# Patient Record
Sex: Female | Born: 1935 | Race: White | Hispanic: No | Marital: Married | Smoking: Never smoker
Health system: Southern US, Community
[De-identification: ages and names within clinical notes are randomized; demographics above are authoritative.]

## PROBLEM LIST (undated history)

## (undated) DIAGNOSIS — J309 Allergic rhinitis, unspecified: Secondary | ICD-10-CM

## (undated) DIAGNOSIS — E785 Hyperlipidemia, unspecified: Secondary | ICD-10-CM

## (undated) DIAGNOSIS — E109 Type 1 diabetes mellitus without complications: Secondary | ICD-10-CM

## (undated) DIAGNOSIS — M810 Age-related osteoporosis without current pathological fracture: Secondary | ICD-10-CM

## (undated) DIAGNOSIS — I4891 Unspecified atrial fibrillation: Secondary | ICD-10-CM

## (undated) DIAGNOSIS — I10 Essential (primary) hypertension: Secondary | ICD-10-CM

## (undated) DIAGNOSIS — E039 Hypothyroidism, unspecified: Secondary | ICD-10-CM

## (undated) HISTORY — DX: Hyperlipidemia, unspecified: E78.5

## (undated) HISTORY — DX: Allergic rhinitis, unspecified: J30.9

## (undated) HISTORY — DX: Essential (primary) hypertension: I10

## (undated) HISTORY — PX: VESICOVAGINAL FISTULA CLOSURE W/ TAH: SUR271

## (undated) HISTORY — DX: Unspecified atrial fibrillation: I48.91

## (undated) HISTORY — DX: Hypothyroidism, unspecified: E03.9

## (undated) HISTORY — DX: Type 1 diabetes mellitus without complications: E10.9

## (undated) HISTORY — DX: Age-related osteoporosis without current pathological fracture: M81.0

## (undated) HISTORY — PX: NASAL SINUS SURGERY: SHX719

---

## 2004-11-14 ENCOUNTER — Ambulatory Visit: Payer: Self-pay | Admitting: Internal Medicine

## 2005-01-05 ENCOUNTER — Ambulatory Visit: Payer: Self-pay | Admitting: Endocrinology

## 2005-06-24 ENCOUNTER — Ambulatory Visit: Payer: Self-pay | Admitting: Endocrinology

## 2005-10-28 ENCOUNTER — Ambulatory Visit: Payer: Self-pay | Admitting: Internal Medicine

## 2005-10-28 ENCOUNTER — Ambulatory Visit: Payer: Self-pay | Admitting: Endocrinology

## 2006-02-02 ENCOUNTER — Ambulatory Visit: Payer: Self-pay | Admitting: Endocrinology

## 2006-05-11 ENCOUNTER — Ambulatory Visit: Payer: Self-pay | Admitting: Internal Medicine

## 2006-07-07 ENCOUNTER — Ambulatory Visit: Payer: Self-pay | Admitting: Endocrinology

## 2006-11-03 ENCOUNTER — Ambulatory Visit: Payer: Self-pay | Admitting: Endocrinology

## 2007-03-16 ENCOUNTER — Ambulatory Visit: Payer: Self-pay | Admitting: Endocrinology

## 2007-03-16 LAB — CONVERTED CEMR LAB: Hgb A1c MFr Bld: 7.7 % — ABNORMAL HIGH (ref 4.6–6.0)

## 2007-04-26 ENCOUNTER — Encounter: Payer: Self-pay | Admitting: Endocrinology

## 2007-04-26 DIAGNOSIS — I10 Essential (primary) hypertension: Secondary | ICD-10-CM | POA: Insufficient documentation

## 2007-04-26 DIAGNOSIS — M810 Age-related osteoporosis without current pathological fracture: Secondary | ICD-10-CM | POA: Insufficient documentation

## 2007-04-26 DIAGNOSIS — I482 Chronic atrial fibrillation, unspecified: Secondary | ICD-10-CM | POA: Insufficient documentation

## 2007-04-26 DIAGNOSIS — E039 Hypothyroidism, unspecified: Secondary | ICD-10-CM

## 2007-04-26 DIAGNOSIS — E109 Type 1 diabetes mellitus without complications: Secondary | ICD-10-CM | POA: Insufficient documentation

## 2007-04-26 DIAGNOSIS — J309 Allergic rhinitis, unspecified: Secondary | ICD-10-CM | POA: Insufficient documentation

## 2007-07-25 ENCOUNTER — Encounter: Payer: Self-pay | Admitting: Endocrinology

## 2007-07-25 ENCOUNTER — Ambulatory Visit: Payer: Self-pay | Admitting: Endocrinology

## 2007-07-25 LAB — CONVERTED CEMR LAB: Hgb A1c MFr Bld: 7.8 % — ABNORMAL HIGH (ref 4.6–6.0)

## 2007-08-30 ENCOUNTER — Encounter: Payer: Self-pay | Admitting: Endocrinology

## 2007-11-22 ENCOUNTER — Ambulatory Visit: Payer: Self-pay | Admitting: Endocrinology

## 2008-01-16 ENCOUNTER — Encounter: Payer: Self-pay | Admitting: Endocrinology

## 2008-04-03 ENCOUNTER — Ambulatory Visit: Payer: Self-pay | Admitting: Endocrinology

## 2008-04-05 ENCOUNTER — Telehealth (INDEPENDENT_AMBULATORY_CARE_PROVIDER_SITE_OTHER): Payer: Self-pay | Admitting: *Deleted

## 2008-06-14 ENCOUNTER — Telehealth: Payer: Self-pay | Admitting: Endocrinology

## 2008-07-10 ENCOUNTER — Ambulatory Visit: Payer: Self-pay | Admitting: Endocrinology

## 2008-07-10 LAB — CONVERTED CEMR LAB: Hgb A1c MFr Bld: 8.2 % — ABNORMAL HIGH (ref 4.6–6.0)

## 2008-10-09 ENCOUNTER — Ambulatory Visit: Payer: Self-pay | Admitting: Endocrinology

## 2008-10-22 ENCOUNTER — Ambulatory Visit: Payer: Self-pay | Admitting: Internal Medicine

## 2008-10-29 LAB — CONVERTED CEMR LAB

## 2008-11-03 DIAGNOSIS — J45909 Unspecified asthma, uncomplicated: Secondary | ICD-10-CM | POA: Insufficient documentation

## 2008-12-04 ENCOUNTER — Ambulatory Visit: Payer: Self-pay | Admitting: Endocrinology

## 2009-01-03 ENCOUNTER — Encounter: Payer: Self-pay | Admitting: Endocrinology

## 2009-01-16 ENCOUNTER — Encounter: Payer: Self-pay | Admitting: Internal Medicine

## 2009-02-12 ENCOUNTER — Ambulatory Visit: Payer: Self-pay | Admitting: Endocrinology

## 2009-04-07 ENCOUNTER — Encounter: Payer: Self-pay | Admitting: Endocrinology

## 2009-05-30 ENCOUNTER — Ambulatory Visit: Payer: Self-pay | Admitting: Endocrinology

## 2009-05-30 LAB — CONVERTED CEMR LAB
Creatinine,U: 45.4 mg/dL
Microalb Creat Ratio: 6.6 mg/g (ref 0.0–30.0)
Microalb, Ur: 0.3 mg/dL (ref 0.0–1.9)

## 2009-07-10 ENCOUNTER — Telehealth: Payer: Self-pay | Admitting: Endocrinology

## 2009-07-25 ENCOUNTER — Encounter: Payer: Self-pay | Admitting: Endocrinology

## 2009-07-25 ENCOUNTER — Telehealth: Payer: Self-pay | Admitting: Endocrinology

## 2009-08-01 ENCOUNTER — Telehealth: Payer: Self-pay | Admitting: Endocrinology

## 2009-08-07 ENCOUNTER — Encounter: Payer: Self-pay | Admitting: Endocrinology

## 2009-09-10 ENCOUNTER — Ambulatory Visit: Payer: Self-pay | Admitting: Endocrinology

## 2009-09-18 ENCOUNTER — Telehealth: Payer: Self-pay | Admitting: Endocrinology

## 2009-12-10 ENCOUNTER — Ambulatory Visit: Payer: Self-pay | Admitting: Endocrinology

## 2009-12-10 LAB — CONVERTED CEMR LAB: Hgb A1c MFr Bld: 7.8 % — ABNORMAL HIGH (ref 4.6–6.5)

## 2010-02-04 ENCOUNTER — Ambulatory Visit: Payer: Self-pay | Admitting: Internal Medicine

## 2010-02-04 DIAGNOSIS — J018 Other acute sinusitis: Secondary | ICD-10-CM

## 2010-04-29 ENCOUNTER — Ambulatory Visit: Payer: Self-pay | Admitting: Endocrinology

## 2010-10-19 ENCOUNTER — Encounter: Payer: Self-pay | Admitting: Endocrinology

## 2010-10-20 ENCOUNTER — Telehealth: Payer: Self-pay | Admitting: Endocrinology

## 2010-10-28 NOTE — Assessment & Plan Note (Signed)
Summary: 3 mos f//u//cd   Vital Signs:  Patient profile:   77 year old female Height:      66 inches (167.64 cm) Weight:      174 pounds (79.09 kg) O2 Sat:      96 % on Room air Temp:     98.2 degrees F (36.78 degrees C) oral Pulse rate:   72 / minute BP sitting:   116 / 66  (left arm) Cuff size:   large  Vitals Entered By: Josph Macho RMA (December 10, 2009 3:09 PM)  O2 Flow:  Room air CC: 3 month follow up/ CF Is Patient Diabetic? Yes   Primary Provider:  kinlaw  CC:  3 month follow up/ CF.  History of Present Illness: pt says she feels much better since she had a recent illness.  she has mild hypoglycemia approx 2/week, at any time of day.   she brings a scant record of her cbg's which i have reviewed today.    Current Medications (verified): 1)  Humalog Pen 100 Unit/ml  Soln (Insulin Lispro (Human)) .... Qac (Three Times A Day) 06-08-10 Units 2)  Metformin Hcl 1000 Mg  Tabs (Metformin Hcl) .... Take 1 By Mouth Two Times A Day Qd 3)  Lipitor 20 Mg  Tabs (Atorvastatin Calcium) .... Take 1 By Mouth Qd 4)  Lisinopril-Hydrochlorothiazide 20-12.5 Mg  Tabs (Lisinopril-Hydrochlorothiazide) .... Take 1 By Mouth Qd 5)  Cardizem La 240 Mg  Tb24 (Diltiazem Hcl Coated Beads) .... Take 1 By Mouth Qd 6)  Synthroid 150 Mcg  Tabs (Levothyroxine Sodium) .... Take 1 By Mouth Qd 7)  Novolin N 100 Unit/ml  Susp (Insulin Isophane Human) .Marland Kitchen.. 14 Units Qhs 8)  Warfarin Sodium 5 Mg Tabs (Warfarin Sodium) .... Take 1 By Mouth Qd 9)  Guanfacine Hcl 2 Mg Tabs (Guanfacine Hcl) .Marland Kitchen.. 1 Two Times A Day 10)  Fluticasone Propionate 50 Mcg/act Susp (Fluticasone Propionate) .Marland Kitchen.. 1-2 Sprays Each Nostril Daily 11)  Proair Hfa 108 (90 Base) Mcg/act Aers (Albuterol Sulfate) .... 2 Puffs Four Times A Day As Needed 12)  One-A-Day Womens  Tabs (Multiple Vitamins-Calcium) .... One Daily 13)  Immune Health Blend  Caps (Echinacea-Goldenseal) .Marland Kitchen.. 1 Tab Daily 14)  Calcium 600/vitamin D 600-400 Mg-Unit Tabs (Calcium  Carbonate-Vitamin D) .Marland Kitchen.. 1 Daily 15)  Freestyle Test  Strp (Glucose Blood) .... Use 1 Strip Four Times A Day  Allergies (verified): 1)  ! Penicillin  Past History:  Past Medical History: Last updated: 10/09/2008 Smoker (Quit 1973) DM Nepharopathy Menopause Dyslipidemia Urolithiasis Macular Degeneration  OSTEOPOROSIS (ICD-733.00) HYPOTHYROIDISM (ICD-244.9) HYPERTENSION (ICD-401.9) DIABETES MELLITUS, TYPE I (ICD-250.01) ATRIAL FIBRILLATION (ICD-427.31) ALLERGIC RHINITIS (ICD-477.9)  Review of Systems  The patient denies syncope.    Physical Exam  General:  normal appearance.   Pulses:  dorsalis pedis intact bilat.   Extremities:  no deformity.  no ulcer on the feet.  feet are of normal color and temp.  no edema.  there are a few bilateral varicosities.    Neurologic:  sensation is intact to touch on the feet  Additional Exam:  Hemoglobin A1C       [H]  7.8 %    Impression & Recommendations:  Problem # 1:  DIABETES MELLITUS, TYPE I (ICD-250.01) this is the best control this pt should aim for, givenher hypoglycemic episodes.  Medications Added to Medication List This Visit: 1)  Humalog Pen 100 Unit/ml Soln (Insulin lispro (human)) .... Three times a day (just before each meal)  06-08-10 units  2)  Novolin N 100 Unit/ml Susp (Insulin isophane human) .Marland Kitchen.. 14 units at bedtime  Other Orders: TLB-A1C / Hgb A1C (Glycohemoglobin) (83036-A1C) Est. Patient Level III ([address])  Patient Instructions: 1)  tests are being ordered for you today.  a few days after the test(s), please call [phone] to hear your test results. 2)  pending the test results, please continue nph 13 units at bedtime. 3)  and continue humalog three times a day (just before each meal) 06-08-10 units. 4)  add 1 unit of humalog as needed for any glucose over 200 5)  Please schedule a follow-up appointment in 3 months. 6)  check your blood sugar 2 times a day.  vary the time of day when you check, between  before the 3 meals, and at bedtime.  also check if you have symptoms of your blood sugar being too high or too low.  please keep a record of the readings and bring it to your next appointment here.  please call us sooner if you are having low blood sugar episodes.

## 2010-10-28 NOTE — Assessment & Plan Note (Signed)
Summary: fu---d/t---stc   Vital Signs:  Patient profile:   77 year old female Height:      66 inches (167.64 cm) Weight:      174.19 pounds (79.18 kg) BMI:     28.22 O2 Sat:      96 % on Room air Temp:     97.4 degrees F (36.33 degrees C) oral Pulse rate:   64 / minute BP sitting:   148 / 88  (left arm) Cuff size:   regular  Vitals Entered By: Brenton Grills MA (April 29, 2010 1:10 PM)  O2 Flow:  Room air CC: F/U appt/both ankles swelling/pt is no longer taking Clarithromycin/aj Is Patient Diabetic? Yes   Primary Provider:  kinlaw  CC:  F/U appt/both ankles swelling/pt is no longer taking Clarithromycin/aj.  History of Present Illness: pt states she feels well in general, except for nasal congestion.  no cbg record, but states cbg's are well-controlled (except for steroid inject 4 mos ago).  she seldom has hypoglycemia, and these episodes are mild.  Current Medications (verified): 1)  Humalog Pen 100 Unit/ml  Soln (Insulin Lispro (Human)) .... Three Times A Day (Just Before Each Meal)  06-08-10 Units 2)  Metformin Hcl 1000 Mg  Tabs (Metformin Hcl) .... Take 1 By Mouth Two Times A Day Qd 3)  Lipitor 20 Mg  Tabs (Atorvastatin Calcium) .... Take 1 By Mouth Qd 4)  Lisinopril-Hydrochlorothiazide 20-12.5 Mg  Tabs (Lisinopril-Hydrochlorothiazide) .... Take 1 By Mouth Qd 5)  Cardizem La 240 Mg  Tb24 (Diltiazem Hcl Coated Beads) .... Take 1 By Mouth Qd 6)  Synthroid 150 Mcg  Tabs (Levothyroxine Sodium) .... Take 1 By Mouth Qd 7)  Novolin N 100 Unit/ml  Susp (Insulin Isophane Human) .Marland Kitchen.. 14 Units At Bedtime 8)  Warfarin Sodium 5 Mg Tabs (Warfarin Sodium) .... Take 1 By Mouth Qd 9)  Guanfacine Hcl 2 Mg Tabs (Guanfacine Hcl) .Marland Kitchen.. 1 Two Times A Day 10)  Fluticasone Propionate 50 Mcg/act Susp (Fluticasone Propionate) .Marland Kitchen.. 1-2 Sprays Each Nostril Daily 11)  Proair Hfa 108 (90 Base) Mcg/act Aers (Albuterol Sulfate) .... 2 Puffs Four Times A Day As Needed 12)  One-A-Day Womens  Tabs (Multiple  Vitamins-Calcium) .... One Daily 13)  Immune Health Blend  Caps (Echinacea-Goldenseal) .Marland Kitchen.. 1 Tab Daily 14)  Calcium 600/vitamin D 600-400 Mg-Unit Tabs (Calcium Carbonate-Vitamin D) .Marland Kitchen.. 1 Daily 15)  Freestyle Test  Strp (Glucose Blood) .... Use 1 Strip Four Times A Day 16)  Clarithromycin 500 Mg Tabs (Clarithromycin) .Marland Kitchen.. 1 Twice Daily After Meals  Allergies (verified): 1)  ! Penicillin  Past History:  Past Medical History: Last updated: 10/09/2008 Smoker (Quit 1973) DM Nepharopathy Menopause Dyslipidemia Urolithiasis Macular Degeneration  OSTEOPOROSIS (ICD-733.00) HYPOTHYROIDISM (ICD-244.9) HYPERTENSION (ICD-401.9) DIABETES MELLITUS, TYPE I (ICD-250.01) ATRIAL FIBRILLATION (ICD-427.31) ALLERGIC RHINITIS (ICD-477.9)  Review of Systems  The patient denies syncope.    Physical Exam  General:  normal appearance.   Pulses:  dorsalis pedis intact bilat.   Extremities:  no deformity.  no ulcer on the feet.  feet are of normal color and temp.  there are a few bilateral varicosities.   1+ right pedal edema and 1+ left pedal edema.   Neurologic:  sensation is intact to touch on the feet Additional Exam:  Hemoglobin A1C       [H]  7.9 %    Impression & Recommendations:  Problem # 1:  DIABETES MELLITUS, TYPE I (ICD-250.01) needs increased rx  Medications Added to Medication List This  Visit: 1)  Humalog Pen 100 Unit/ml Soln (Insulin lispro (human)) .... Three times a day (just before each meal)  07-08-10 units  Other Orders: TLB-A1C / Hgb A1C (Glycohemoglobin) (83036-A1C) Est. Patient Level III (69629)  Patient Instructions: 1)  blood tests are being ordered for you today.  please call [phone] to hear your test results. 2)  pending the test results, please continue the same medications for now 3)  Please schedule a follow-up appointment in 3 months. 4)  (update: i left message on phone-tree:  increase humalog to (just before each meal) 07-08-10 units).

## 2010-10-28 NOTE — Assessment & Plan Note (Signed)
Summary: PER PT CALL/CB   Primary Provider/Referring Provider:  kinlaw  CC:  Increased Allergies-pain in sinus area; Pressure(stopped up) in both ears..  History of Present Illness: 10/22/08- Asthma, allergic rhinitis Had diabetic retinopathy- lost her license for awhile but can drive and read again now. She had atrial fib- hosp, then nuclear stress test normal but still on warfarin- managed in [place].  Not on allergy vaccine in several years (Dr Andria Meuse). More trouble with nasal and chest congestion since October. Current episode started a week ago with frontal headache, sore throat but no fever.  Clear mucus from nose and chest- mucinex helps. Mild GI queasy. Ears stopped up.No inhaled medicines now, but remembers that Flonase and rescue inhalers used to help.  Feb 04, 2010- Asthma, allergic rhinitis, DM, AF/ Coumadin Acute visit. She had been fine this spring, until she woke this AM with right maxiallary and ear pain, headache. Nose has been stuffy and dripping, but not blowing out much. Chest seems a little tight. She used her Proair with some help.      Current Medications (verified): 1)  Humalog Pen 100 Unit/ml  Soln (Insulin Lispro (Human)) .... Three Times A Day (Just Before Each Meal)  06-08-10 Units 2)  Metformin Hcl 1000 Mg  Tabs (Metformin Hcl) .... Take 1 By Mouth Two Times A Day Qd 3)  Lipitor 20 Mg  Tabs (Atorvastatin Calcium) .... Take 1 By Mouth Qd 4)  Lisinopril-Hydrochlorothiazide 20-12.5 Mg  Tabs (Lisinopril-Hydrochlorothiazide) .... Take 1 By Mouth Qd 5)  Cardizem La 240 Mg  Tb24 (Diltiazem Hcl Coated Beads) .... Take 1 By Mouth Qd 6)  Synthroid 150 Mcg  Tabs (Levothyroxine Sodium) .... Take 1 By Mouth Qd 7)  Novolin N 100 Unit/ml  Susp (Insulin Isophane Human) .Marland Kitchen.. 14 Units At Bedtime 8)  Warfarin Sodium 5 Mg Tabs (Warfarin Sodium) .... Take 1 By Mouth Qd 9)  Guanfacine Hcl 2 Mg Tabs (Guanfacine Hcl) .Marland Kitchen.. 1 Two Times A Day 10)  Fluticasone Propionate 50 Mcg/act Susp  (Fluticasone Propionate) .Marland Kitchen.. 1-2 Sprays Each Nostril Daily 11)  Proair Hfa 108 (90 Base) Mcg/act Aers (Albuterol Sulfate) .... 2 Puffs Four Times A Day As Needed 12)  One-A-Day Womens  Tabs (Multiple Vitamins-Calcium) .... One Daily 13)  Immune Health Blend  Caps (Echinacea-Goldenseal) .Marland Kitchen.. 1 Tab Daily 14)  Calcium 600/vitamin D 600-400 Mg-Unit Tabs (Calcium Carbonate-Vitamin D) .Marland Kitchen.. 1 Daily 15)  Freestyle Test  Strp (Glucose Blood) .... Use 1 Strip Four Times A Day  Allergies (verified): 1)  ! Penicillin  Past History:  Past Medical History: Last updated: 10/09/2008 Smoker (Quit 1973) DM Nepharopathy Menopause Dyslipidemia Urolithiasis Macular Degeneration  OSTEOPOROSIS (ICD-733.00) HYPOTHYROIDISM (ICD-244.9) HYPERTENSION (ICD-401.9) DIABETES MELLITUS, TYPE I (ICD-250.01) ATRIAL FIBRILLATION (ICD-427.31) ALLERGIC RHINITIS (ICD-477.9)  Past Surgical History: Last updated: 10/22/2008 Hysterectomy Left ovary only Sinus surgery  Family History: Last updated: 10/22/2008 Mother's side- asthma, allergy  Social History: Last updated: 10/22/2008 Patient states former smoker. - 40 yrs ago Married  Risk Factors: Smoking Status: quit (10/22/2008)  Review of Systems      See HPI       The patient complains of headaches and nasal congestion/difficulty breathing through nose.  The patient denies shortness of breath with activity, shortness of breath at rest, productive cough, non-productive cough, coughing up blood, chest pain, irregular heartbeats, acid heartburn, indigestion, loss of appetite, weight change, abdominal pain, difficulty swallowing, sore throat, tooth/dental problems, and sneezing.    Vital Signs:  Patient profile:   77 year  old female Height:      66 inches Weight:      166 pounds BMI:     26.89 O2 Sat:      97 % on Room air Pulse rate:   71 / minute BP sitting:   140 / 78  (left arm) Cuff size:   regular  Vitals Entered By: Reynaldo Minium CMA (Feb 04, 2010 1:55 PM)  O2 Flow:  Room air  Physical Exam  Additional Exam:  General: A/Ox3; pleasant and cooperative, NAD, hard of hearing SKIN: no rash, lesions NODES: no lymphadenopathy HEENT: Pace/AT, EOM- WNL, Conjuctivae- clear, PERRLA, TM-WNL, Nose- clear, Throat- clear and wnl, Mallampati  II NECK: Supple w/ fair ROM, JVD- none, normal carotid impulses w/o bruits Thyroid-  CHEST: Clear to P&A HEART: RRR, no m/g/r heard ABDOMEN: Soft and nl;  XFG:HWEX, nl pulses, no edema  NEURO: Grossly intact to observation      Impression & Recommendations:  Problem # 1:  RHINOSINUSITIS, ACUTE (ICD-461.8)  Rhinosinusitis. Abrupt onset favors some infection. We discussed antibiotics and coumadin. Her updated medication list for this problem includes:    Fluticasone Propionate 50 Mcg/act Susp (Fluticasone propionate) .Marland Kitchen... 1-2 sprays each nostril daily    Clarithromycin 500 Mg Tabs (Clarithromycin) .Marland Kitchen... 1 twice daily after meals  Problem # 2:  ASTHMA (ICD-493.90) Not yet flaring with this acute episode.  Medications Added to Medication List This Visit: 1)  Clarithromycin 500 Mg Tabs (Clarithromycin) .Marland Kitchen.. 1 twice daily after meals  Other Orders: Est. Patient Level III (93716) Prescription Created Electronically [phone])  Patient Instructions: 1)  Please schedule a follow-up appointment in 1 year. Call sooner if needed. 2)  Script for antibiotic sent to your drug store. Reduce your coumadin dose by half while on antibiotic. 3)  neb neo nasal 4)  This is a good time to use Sudafed for congestion, and a Neti pot to help clear your nasal passages. Prescriptions: CLARITHROMYCIN 500 MG TABS (CLARITHROMYCIN) 1 twice daily after meals  #14 x 0   Entered and Authorized by:   Waymon Budge MD   Signed by:   Waymon Budge MD on 02/04/2010   Method used:   Electronically to        [facility]* (retail)       [address] [place], [place]   [id number]       Ph: [phone]       Fax: [phone]   RxID:   [id number]

## 2010-10-29 ENCOUNTER — Encounter (INDEPENDENT_AMBULATORY_CARE_PROVIDER_SITE_OTHER): Payer: PRIVATE HEALTH INSURANCE

## 2010-10-29 ENCOUNTER — Encounter (INDEPENDENT_AMBULATORY_CARE_PROVIDER_SITE_OTHER): Payer: PRIVATE HEALTH INSURANCE | Admitting: Vascular Surgery

## 2010-10-29 DIAGNOSIS — I83009 Varicose veins of unspecified lower extremity with ulcer of unspecified site: Secondary | ICD-10-CM

## 2010-10-29 DIAGNOSIS — L97919 Non-pressure chronic ulcer of unspecified part of right lower leg with unspecified severity: Secondary | ICD-10-CM

## 2010-10-29 DIAGNOSIS — I83219 Varicose veins of right lower extremity with both ulcer of unspecified site and inflammation: Secondary | ICD-10-CM

## 2010-10-30 NOTE — Progress Notes (Signed)
Summary: FYI  Phone Note Outgoing Call Call back at [facility] Phone [phone]   Call placed by: Brenton Grills CMA Duncan Dull),  October 20, 2010 11:54 AM Call placed to: Patient Summary of Call: Per MD, pt is due for F/U OV. Pt states that because of recent health problems she is unable to travel out of town to see SAE and is now seeing Dr. Lossie Faes for her DM and as her PCP.

## 2010-10-30 NOTE — Medication Information (Signed)
Summary: Diabetic supplies/[facility]  Diabetic supplies/[facility]   Imported By: Lester [place] 10/23/2010 09:45:24  _____________________________________________________________________  External Attachment:    Type:   Image     Comment:   External Document

## 2010-11-07 NOTE — Procedures (Unsigned)
LOWER EXTREMITY VENOUS REFLUX EXAM  INDICATION:  Ruptured varicose vein ulcer, swelling, pain.  EXAM:  Using color-flow imaging and pulse Doppler spectral analysis, the bilateral common femoral, superficial femoral, popliteal, posterior tibial, greater and lesser saphenous veins are evaluated.  There is evidence suggesting deep venous insufficiency in the bilateral common femoral vein and the right superficial femoral vein of the lower extremity.  The bilateral saphenofemoral junction is competent.  The bilateral GSV is competent.  The  bilateral proximal short saphenous vein demonstrates competency.  GSV Diameter (used if found to be incompetent only)                                           Right    Left Proximal Greater Saphenous Vein           cm       cm Proximal-to-mid-thigh                     cm       cm Mid thigh                                 cm       cm Mid-distal thigh                          cm       cm Distal thigh                              cm       cm Knee                                      cm       cm  IMPRESSION: 1. Bilateral greater saphenous vein is competent. 2. The bilateral greater saphenous vein is not tortuous. 3. The deep venous system is not competent at the bilateral common     femoral vein and the right superficial femoral vein. 4. The bilateral short saphenous vein is competent.  ___________________________________________ Larina Earthly, M.D.  LT/MEDQ  D:  10/29/2010  T:  10/29/2010  Job:  [id number]

## 2010-11-10 NOTE — Consult Note (Signed)
NEW PATIENT CONSULTATION  [name], [name] DOB:  Dec 17, 1932                                       10/29/2010 CHART#:[id number]  HISTORY OF PRESENT ILLNESS:  The patient presents today for evaluation of swelling and venous hypertension, and venous ulceration over her left leg.  She is a 77 year old white female with a long history of lower extremity swelling.  She has had mild ulceration over her right calf in the past.  She apparently had an episode of severe fluid overload with marked swelling in both lower extremities and apparently pulmonary edema as well.  She been treated with increased diuresis and has less swelling and less breathing difficulty.  She, after the extensive swelling, was left with a very large lateral posterior left calf venous ulcer.  She has a large eschar present over this.  She does have some pain associated with this as well.  She is a long-term diabetic and is insulin dependent.  She also has hypertension.  PAST SURGICAL HISTORY:  Significant for tonsillectomy, hysterectomy, and sinus surgery.  SOCIAL HISTORY:  She is married with 4 children.  She is retired.  She does not smoke or drink alcohol.  FAMILY HISTORY:  Negative for premature atherosclerotic disease.  REVIEW OF SYSTEMS:  Positive for weight gain up to 166 pounds.  She is 5 feet 5 inches tall.  She does have pain in her feet and nonhealing ulceration. CARDIAC:  Positive for atrial fibrillation. GI:  Constipation. PULMONARY:  Asthma. ENT:  Change in hearing. SKIN:  Ulcer of her left calf. Review of systems otherwise negative.  PHYSICAL EXAM:  Well-developed, well-nourished, white female appearing stated age, in no acute stress.  Blood pressure is 150/74, pulse 83, respirations 18.  HEENT is normal.  She does have 2+ radial and 2+ dorsalis pedis pulses bilaterally.  Musculoskeletal:  No major deformities or cyanosis.  Neurologic:  No focal weakness or paresthesias.   Skin:  Without rashes.  She does have some superficial ulcerations over her right medial calf and has a large 5 x 7-cm eschar on her left posterior lateral calf.  She underwent noninvasive vascular laboratory studies in our office today which I independently reviewed with her.  This shows no evidence of significant reflux in her surface veins.  She does have some reflux in her common femoral veins bilaterally.  She does not have any evidence of DVT.  ASSESSMENT AND PLAN:  I discussed this at length with the patient, with her husband present.  I explained that this is related to the swelling. I explained that she does not have any correctable source of her venous hypertension.  I explained the critical importance of elevation and compression.  We have started her today on Silvadene ointment to the open eschar and wrapped her with an Ace compression dressing, and instructed her on the use of this.  She was instructed to wear this daily when she is up walking.  Once she has healed the ulcer, she understands that she will need lifelong compression to prevent and reduce the risk for recurrence.  We will see her again in 1 month to continue followup.    Ann [name], M.D.  TFE/MEDQ  D:  10/29/2010  T:  10/30/2010  Job:  [id number]

## 2010-12-03 ENCOUNTER — Ambulatory Visit (INDEPENDENT_AMBULATORY_CARE_PROVIDER_SITE_OTHER): Payer: PRIVATE HEALTH INSURANCE | Admitting: Vascular Surgery

## 2010-12-03 DIAGNOSIS — I83893 Varicose veins of bilateral lower extremities with other complications: Secondary | ICD-10-CM

## 2010-12-04 NOTE — Assessment & Plan Note (Signed)
OFFICE VISIT  [name], [name] DOB:  1932/12/28                                       12/03/2010 CHART#:[id number]  The patient presented today for followup of her ulceration of her left posterior calf.  I had seen her initially on August 29, 2011.  She underwent venous ulcer at that time and did not have any evidence of chronic venous hypertension.  She has done very well with elevation and compression and is being treated at a wound center in [place] currently with Santyl for debridement of the ulcer.  She continues to have a 2 to 3+ dorsalis pedis pulse in her left leg.  Her blood pressure today is 145/74, heart rate is 45, respirations 18.  She does have no progression in her ulcer.  The eschar is beginning to be debrided with the dressing changes.  She has approximately 20% of the ulcer with granulation tissue and the remaining stable with softening of eschar.  I am pleased with her progress.  She will continue her followup in the [facility] and will see [place] again on an as-needed basis.    Larina Earthly, M.D. Electronically Signed  TFE/MEDQ  D:  12/03/2010  T:  12/04/2010  Job:  [id number]  cc:   Marlow Baars, MD, [place], [place]

## 2011-01-21 ENCOUNTER — Other Ambulatory Visit: Payer: Self-pay | Admitting: *Deleted

## 2011-01-21 MED ORDER — ALBUTEROL SULFATE HFA 108 (90 BASE) MCG/ACT IN AERS: 2.0000 | INHALATION_SPRAY | Freq: Four times a day (QID) | RESPIRATORY_TRACT | Status: DC | PRN

## 2011-02-10 NOTE — Consult Note (Signed)
[facility]                          ENDOCRINOLOGY CONSULTATION   EMMALIN, JAQUESS                       MRN:          [id number]  DATE:03/16/2007                            DOB:          13-Sep-1933    REASON FOR VISIT:  Follow up diabetes.   HISTORY OF PRESENT ILLNESS:  Patient is a 77 year old woman, who is  having frequent hypoglycemia after breakfast.  Her glucoses are highest  at h.s.  She states, I adjust the insulin as I need to.   PAST MEDICAL HISTORY:  1. Macular degeneration.  2. Osteoporosis.  3. Atrial fibrillation.  4. Hypertension.  5. Urolithiasis.  6. Allergic rhinitis.  7. Dyslipidemia.  8. Hypothyroidism.  9. Menopause.  10.Diabetes nephropathy.   REVIEW OF SYSTEMS:  Denies loss of consciousness.   PHYSICAL EXAMINATION:  Blood pressure 153/75, heart rate 66, temperature  is 98.6, the weight is 186.  GENERAL:  No distress.  Insulin injection sites at the anterior abdomen  are normal.   LABORATORY STUDIES:  On March 16, 2007, hemoglobin A1c 7.7.   IMPRESSION:  Given her refusal of insulin pump therapy and her self-  adjustments of her insulin, she probably will not achieve and keep an  A1c less than 7.  I will do the best I can.   PLAN:  1. Decrease Lantus to 16 units q.h.s.  2. Change q.a.c. Humalog to 05-03-12.  3. Return in about four months.  4. Do not adjust the insulin, except you can take one extra unit of      Humalog p.r.n. any glucose above 200.     Sean A. Everardo All, MD  Electronically Signed    SAE/MedQ  DD: 03/18/2007  DT: 03/18/2007  Job #: [id number]   cc:   [facility]

## 2011-02-13 NOTE — Assessment & Plan Note (Signed)
[facility]                               PULMONARY OFFICE NOTE   Ann [name], Ann [name]                       MRN:          [id number]  DATE:05/11/2006                            DOB:          19-Jan-1933    PROBLEM LIST:  1. Asthma.  2. Allergic rhinitis.  3. Diabetes.  4. Hypertension.  5. Atrial fibrillation.   HISTORY:  This woman with a history of allergic rhinitis and previous sinus  surgery was last here in February 2006.  She returns now for followup  reporting that Dr. Lawana Pai had to treat her for a sinusitis with antibiotics  2-3 weeks ago.  She feels she is almost completely recovered from that  episode, except that she still has some pressure under the right eye, a  little soreness in the right ear, and at the right side of her nose.  She is  still blowing some thick white discharge but nothing purulent or bloody.  She had tried Astelin of year or more in the past and would like a new  prescription, feeling that may be appropriate for this.   MEDICATIONS:  1. Insulin.  2. Metformin.  3. Lipitor 20 mg.  4. Flonase.  5. Levothroid 0.125 mg.  6. Lisinopril 20/12.5.  7. Guaifenesin.  8. Diltiazem ER 240 mg.  9. Actonel 35 mg.  10.Aspirin.  11.Rescue albuterol inhaler.   DRUG INTOLERANCE:  PENICILLIN.   OBJECTIVE:  VITAL SIGNS:  Weight 186 pounds, BP 132/84, pulse feels regular  to me at 76 per minute with no audible murmur or gallop.  Room air  saturation at rest is 98%.  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  There was some wax in  the left canal without obstruction.  The exposed drum looked intact.  Nasal  mucosa was wet but not obstructed.  Pharynx was clear.  Voice quality was  normal.  LUNGS:  Clear to P&A.  HEART:  Sounds were without murmur or gallop.   IMPRESSION:  Recent acute rhinosinusitis aggravating a background of  allergic  rhinitis.   She has not had a recent flare of her mild intermittent asthma as near as I  can tell and none is demonstrated now.   PLAN:  1. I refilled albuterol as a rescue inhaler and generic Flonase.  2. I also gave a prescription with discussion again of Astelin once or      twice each nostril b.i.d. on a p.r.n. basis.  3. We discussed saline nasal lavage as an early intervention at the first      suspicion of sinus infection.  4. Scheduled return 1 year, earlier p.r.n.                                   Clinton D. Maple Hudson, MD, FCCP, FACP   CDY/MedQ  DD:  05/11/2006  DT:  05/11/2006  Job #:  [id number]   cc:  Roney Marion, MD

## 2011-02-13 NOTE — Consult Note (Signed)
[facility]                          ENDOCRINOLOGY CONSULTATION   Ann [name], Ann [name]                       MRN:          [id number]  DATE:11/03/2006                            DOB:          04/25/1933    REASON FOR VISIT:  Follow up diabetes.   HISTORY OF PRESENT ILLNESS:  A 77 year old woman who states her glucoses  are sometimes in the high 100's in the morning, but well controlled  overall.   PAST MEDICAL HISTORY:  Same as July 07, 2006.   REVIEW OF SYSTEMS:  Occasional mild hypoglycemia at h.s.   PHYSICAL EXAMINATION:  Blood pressure 131/71, heart rate 62, temperature  97.7, weight is 186.  GENERAL:  No distress.  She does not appear anxious or depressed.   LABORATORY DATA:  On November 03, 2006:  Hemoglobin A1c 7.6.   IMPRESSION:  She needs a slight increase in her insulin therapy.   PLAN:  1. Increase Lantus to 17 units daily.  2. Continue q.a.c. Humalog to 05-04-11.  3. Return 3-4 months.     Sean A. Everardo All, MD  Electronically Signed    SAE/MedQ  DD: 11/05/2006  DT: 11/05/2006  Job #: [id number]   cc:   Roney Marion MD, [place], [place]

## 2011-02-13 NOTE — Consult Note (Signed)
[facility]                            ENDOCRINOLOGY CONSULTATION   Ann [name], Ann [name]                       MRN:          [id number]  DATE:07/07/2006                            DOB:          [birth year]    REASON FOR VISIT:  Follow up diabetes.   HISTORY OF PRESENT ILLNESS:  The patient is a 77 year old woman who states  her diabetes is well controlled in general, although her glucoses are a bit  high in the afternoon.   PAST MEDICAL HISTORY:  1. Diabetes nephropathy.  2. Menopause.  3. Hypothyroidism.  4. Dyslipidemia.  5. Allergic rhinitis.  6. Urolithiasis.  7. Hypertension.  8. Atrial fibrillation.  9. Osteoporosis.  10.Macular degeneration.   REVIEW OF SYSTEMS:  She had one episode of hypoglycemia before lunch, when  her glucose was 51.   PHYSICAL EXAMINATION:  VITAL SIGNS:  Blood pressure 137/76, heart rate 67,  temperature is 98.7, her weight is 188.  GENERAL:  In no distress.  FEET:  Normal color and temperature.  There is no ulcer present on the feet.  Dorsalis pedis pulses are intact bilaterally.  There is no edema, and  sensation is intact to touch in the feet.   Laboratory studies on July 07, 2006, urine microalbumin is normal.  Hemoglobin A1c 7.0.   IMPRESSION:  Diabetes with only a slight adjustment in her medication  needed.   PLAN:  1. Continue Lantus 16 units a day.  2. Change q.a.c. Humalog to 8 breakfast, 6 lunch and 12 supper.  3. Return in 4 months.            ______________________________  Cleophas Dunker. Everardo All, MD     SAE/MedQ  DD:  07/09/2006  DT:  07/11/2006  Job #:  [id number]   cc:   Roney Marion, MD

## 2011-02-19 ENCOUNTER — Encounter: Payer: Self-pay | Admitting: Internal Medicine

## 2011-03-03 ENCOUNTER — Ambulatory Visit (INDEPENDENT_AMBULATORY_CARE_PROVIDER_SITE_OTHER): Payer: PRIVATE HEALTH INSURANCE | Admitting: Internal Medicine

## 2011-03-03 ENCOUNTER — Encounter: Payer: Self-pay | Admitting: Internal Medicine

## 2011-03-03 VITALS — BP 114/60 | HR 67 | Ht 66.0 in | Wt 171.2 lb

## 2011-03-03 DIAGNOSIS — J45909 Unspecified asthma, uncomplicated: Secondary | ICD-10-CM

## 2011-03-03 DIAGNOSIS — J309 Allergic rhinitis, unspecified: Secondary | ICD-10-CM

## 2011-03-03 NOTE — Assessment & Plan Note (Signed)
Controlled with fluticasone- she is satisfied to use this with occasional claritin

## 2011-03-03 NOTE — Progress Notes (Signed)
  Subjective:    Patient ID: Ann [name], female    DOB: 07/12/1933, 77 y.o.   MRN: [id number]  HPI 03/03/11- 68 yoF former smoker  Last here 02/04/10. Note reviewed Now here with grandchildren. Reports doing fine now. Incidental left leg peripheral venous insufficiency- no surgery. Has done well over the year except for spring pollen season. Daily claritin, with fluticasone every night. Uses Proair inhaler every night at bedtime and occasionally during the day.   Review of Systems Constitutional:   No weight loss, night sweats,  Fevers, chills, fatigue, lassitude. HEENT:   No headaches,  Difficulty swallowing,  Tooth/dental problems,  Sore throat,                No sneezing, itching, ear ache, nasal congestion, post nasal drip,   CV:  No chest pain,  Orthopnea, PND, swelling in lower extremities, anasarca, dizziness, palpitations  GI  No heartburn, indigestion, abdominal pain, nausea, vomiting, diarrhea, change in bowel habits, loss of appetite  Resp: No shortness of breath with exertion or at rest.  No excess mucus, no productive cough,  No non-productive cough,  No coughing up of blood.  No change in color of mucus.    Skin: no rash or lesions.  GU: no dysuria, change in color of urine, no urgency or frequency.  No flank pain.  MS:  No joint pain or swelling.  No decreased range of motion.  No back pain.  Psych:  No change in mood or affect. No depression or anxiety.  No memory loss.      Objective:   Physical Exam General- Alert, Oriented, Affect-appropriate, Distress- none acute  Skin- rash-none, lesions- none, excoriation- none  Lymphadenopathy- none  Head- atraumatic  Eyes- Gross vision intact, PERRLA, conjunctivae clear secretions  Ears- Hearing, canals, Tm-  Hearing seems diminished  Nose- Clear, No-Septal dev, mucus, polyps, erosion, perforation   Throat- Mallampati II , mucosa clear , drainage- none, tonsils- atrophic  Neck- flexible , trachea midline, no  stridor , thyroid nl, carotid no bruit  Chest - symmetrical excursion , unlabored     Heart/CV- RRR , no murmur , no gallop  , no rub, nl s1 s2                     - JVD- none , edema- none, stasis changes- none, varices- none     Lung- clear to P&A, wheeze- none, cough- none , dullness-none, rub- none     Chest wall-   Abd- tender-no, distended-no, bowel sounds-present, HSM- no  Br/ Gen/ Rectal- Not done, not indicated  Extrem- cyanosis- none, clubbing, none, atrophy- none, strength- nl  Neuro- grossly intact to observation         Assessment & Plan:

## 2011-03-03 NOTE — Patient Instructions (Signed)
Please call for med refills as needed

## 2011-03-03 NOTE — Assessment & Plan Note (Signed)
Mild intermittent asthma. I suggested she try without habitual Proair at bedtime when she can .

## 2011-03-25 ENCOUNTER — Telehealth: Payer: Self-pay | Admitting: Internal Medicine

## 2011-03-25 ENCOUNTER — Other Ambulatory Visit: Payer: Self-pay | Admitting: *Deleted

## 2011-03-25 MED ORDER — ALBUTEROL SULFATE HFA 108 (90 BASE) MCG/ACT IN AERS: 2.0000 | INHALATION_SPRAY | Freq: Four times a day (QID) | RESPIRATORY_TRACT | Status: DC | PRN

## 2011-03-25 NOTE — Telephone Encounter (Signed)
Refill sent. Pharmacy aware. Carron Curie, CMA

## 2011-05-05 ENCOUNTER — Telehealth: Payer: Self-pay | Admitting: Internal Medicine

## 2011-05-05 MED ORDER — FLUTICASONE FUROATE 27.5 MCG/SPRAY NA SUSP: NASAL | Status: DC

## 2011-05-05 NOTE — Telephone Encounter (Signed)
Called pharmacy and pt is requesting Fluticasone, gave verbal for this time with 6 additional refills. Pt aware of same.

## 2012-01-13 ENCOUNTER — Telehealth: Payer: Self-pay | Admitting: Internal Medicine

## 2012-01-13 MED ORDER — ALBUTEROL SULFATE HFA 108 (90 BASE) MCG/ACT IN AERS: 2.0000 | INHALATION_SPRAY | Freq: Four times a day (QID) | RESPIRATORY_TRACT | Status: DC | PRN

## 2012-01-13 NOTE — Telephone Encounter (Signed)
Pt last seen by CDY 6.5.13, upcoming 6.4.13.  Last refill of ventolin hfa was 6.27.13 #1 inhaler with 5 refills.  Do not see in pt's chart any documentation of failed/denied refill.  Called spoke with patient who verified that she is needing refills on her ventolin hfa.  Apologized to pt for any inconvenience and informed her that refills will be sent to her pharmacy.  Pt okay with this and verbalized her understanding.  Refills sent.  Nothing further needed.

## 2012-03-01 ENCOUNTER — Ambulatory Visit: Payer: PRIVATE HEALTH INSURANCE | Admitting: Internal Medicine

## 2013-01-17 ENCOUNTER — Other Ambulatory Visit: Payer: Self-pay | Admitting: Internal Medicine

## 2013-07-13 ENCOUNTER — Encounter (INDEPENDENT_AMBULATORY_CARE_PROVIDER_SITE_OTHER): Payer: Medicare Other | Admitting: Ophthalmology

## 2013-07-13 DIAGNOSIS — I10 Essential (primary) hypertension: Secondary | ICD-10-CM

## 2013-07-13 DIAGNOSIS — H35039 Hypertensive retinopathy, unspecified eye: Secondary | ICD-10-CM

## 2013-07-13 DIAGNOSIS — E11359 Type 2 diabetes mellitus with proliferative diabetic retinopathy without macular edema: Secondary | ICD-10-CM

## 2013-07-13 DIAGNOSIS — H43819 Vitreous degeneration, unspecified eye: Secondary | ICD-10-CM

## 2013-07-13 DIAGNOSIS — H3530 Unspecified macular degeneration: Secondary | ICD-10-CM

## 2013-07-13 DIAGNOSIS — E1139 Type 2 diabetes mellitus with other diabetic ophthalmic complication: Secondary | ICD-10-CM

## 2014-12-06 DIAGNOSIS — E039 Hypothyroidism, unspecified: Secondary | ICD-10-CM | POA: Diagnosis not present

## 2014-12-06 DIAGNOSIS — E1140 Type 2 diabetes mellitus with diabetic neuropathy, unspecified: Secondary | ICD-10-CM | POA: Diagnosis not present

## 2014-12-12 DIAGNOSIS — E780 Pure hypercholesterolemia: Secondary | ICD-10-CM | POA: Diagnosis not present

## 2014-12-12 DIAGNOSIS — N183 Chronic kidney disease, stage 3 (moderate): Secondary | ICD-10-CM | POA: Diagnosis not present

## 2014-12-12 DIAGNOSIS — E119 Type 2 diabetes mellitus without complications: Secondary | ICD-10-CM | POA: Diagnosis not present

## 2014-12-12 DIAGNOSIS — E039 Hypothyroidism, unspecified: Secondary | ICD-10-CM | POA: Diagnosis not present

## 2014-12-12 DIAGNOSIS — Z0000 Encounter for general adult medical examination without abnormal findings: Secondary | ICD-10-CM | POA: Diagnosis not present

## 2014-12-27 DIAGNOSIS — E119 Type 2 diabetes mellitus without complications: Secondary | ICD-10-CM | POA: Diagnosis not present

## 2014-12-27 DIAGNOSIS — Z961 Presence of intraocular lens: Secondary | ICD-10-CM | POA: Diagnosis not present

## 2015-01-29 DIAGNOSIS — I10 Essential (primary) hypertension: Secondary | ICD-10-CM | POA: Diagnosis not present

## 2015-01-29 DIAGNOSIS — N183 Chronic kidney disease, stage 3 (moderate): Secondary | ICD-10-CM | POA: Diagnosis not present

## 2015-01-29 DIAGNOSIS — E109 Type 1 diabetes mellitus without complications: Secondary | ICD-10-CM | POA: Diagnosis not present

## 2015-05-07 DIAGNOSIS — E119 Type 2 diabetes mellitus without complications: Secondary | ICD-10-CM | POA: Diagnosis not present

## 2015-05-07 DIAGNOSIS — Z1231 Encounter for screening mammogram for malignant neoplasm of breast: Secondary | ICD-10-CM | POA: Diagnosis not present

## 2015-05-14 DIAGNOSIS — E119 Type 2 diabetes mellitus without complications: Secondary | ICD-10-CM | POA: Diagnosis not present

## 2015-05-30 DIAGNOSIS — J329 Chronic sinusitis, unspecified: Secondary | ICD-10-CM | POA: Diagnosis not present

## 2015-06-20 DIAGNOSIS — J329 Chronic sinusitis, unspecified: Secondary | ICD-10-CM | POA: Diagnosis not present

## 2015-07-01 DIAGNOSIS — H353233 Exudative age-related macular degeneration, bilateral, with inactive scar: Secondary | ICD-10-CM | POA: Diagnosis not present

## 2015-07-10 DIAGNOSIS — Z23 Encounter for immunization: Secondary | ICD-10-CM | POA: Diagnosis not present

## 2015-08-08 DIAGNOSIS — E785 Hyperlipidemia, unspecified: Secondary | ICD-10-CM | POA: Diagnosis not present

## 2015-08-08 DIAGNOSIS — E119 Type 2 diabetes mellitus without complications: Secondary | ICD-10-CM | POA: Diagnosis not present

## 2015-08-14 DIAGNOSIS — E119 Type 2 diabetes mellitus without complications: Secondary | ICD-10-CM | POA: Diagnosis not present

## 2015-08-14 DIAGNOSIS — Z1389 Encounter for screening for other disorder: Secondary | ICD-10-CM | POA: Diagnosis not present

## 2015-11-05 DIAGNOSIS — E782 Mixed hyperlipidemia: Secondary | ICD-10-CM | POA: Diagnosis not present

## 2015-11-05 DIAGNOSIS — I10 Essential (primary) hypertension: Secondary | ICD-10-CM | POA: Diagnosis not present

## 2015-11-05 DIAGNOSIS — I4891 Unspecified atrial fibrillation: Secondary | ICD-10-CM | POA: Diagnosis not present

## 2015-11-14 DIAGNOSIS — J189 Pneumonia, unspecified organism: Secondary | ICD-10-CM | POA: Diagnosis not present

## 2015-12-05 DIAGNOSIS — E039 Hypothyroidism, unspecified: Secondary | ICD-10-CM | POA: Diagnosis not present

## 2015-12-05 DIAGNOSIS — E119 Type 2 diabetes mellitus without complications: Secondary | ICD-10-CM | POA: Diagnosis not present

## 2015-12-05 DIAGNOSIS — E785 Hyperlipidemia, unspecified: Secondary | ICD-10-CM | POA: Diagnosis not present

## 2015-12-13 DIAGNOSIS — E785 Hyperlipidemia, unspecified: Secondary | ICD-10-CM | POA: Diagnosis not present

## 2015-12-13 DIAGNOSIS — E039 Hypothyroidism, unspecified: Secondary | ICD-10-CM | POA: Diagnosis not present

## 2015-12-13 DIAGNOSIS — Z0000 Encounter for general adult medical examination without abnormal findings: Secondary | ICD-10-CM | POA: Diagnosis not present

## 2015-12-13 DIAGNOSIS — I10 Essential (primary) hypertension: Secondary | ICD-10-CM | POA: Diagnosis not present

## 2015-12-13 DIAGNOSIS — E119 Type 2 diabetes mellitus without complications: Secondary | ICD-10-CM | POA: Diagnosis not present

## 2016-01-22 DIAGNOSIS — N183 Chronic kidney disease, stage 3 (moderate): Secondary | ICD-10-CM | POA: Diagnosis not present

## 2016-02-06 DIAGNOSIS — E109 Type 1 diabetes mellitus without complications: Secondary | ICD-10-CM | POA: Diagnosis not present

## 2016-02-06 DIAGNOSIS — I10 Essential (primary) hypertension: Secondary | ICD-10-CM | POA: Diagnosis not present

## 2016-02-06 DIAGNOSIS — N183 Chronic kidney disease, stage 3 (moderate): Secondary | ICD-10-CM | POA: Diagnosis not present

## 2016-02-09 DIAGNOSIS — I481 Persistent atrial fibrillation: Secondary | ICD-10-CM | POA: Diagnosis not present

## 2016-02-09 DIAGNOSIS — R0600 Dyspnea, unspecified: Secondary | ICD-10-CM | POA: Diagnosis not present

## 2016-02-09 DIAGNOSIS — K5900 Constipation, unspecified: Secondary | ICD-10-CM | POA: Diagnosis not present

## 2016-02-09 DIAGNOSIS — I214 Non-ST elevation (NSTEMI) myocardial infarction: Secondary | ICD-10-CM | POA: Diagnosis not present

## 2016-02-09 DIAGNOSIS — R404 Transient alteration of awareness: Secondary | ICD-10-CM | POA: Diagnosis not present

## 2016-02-09 DIAGNOSIS — J9601 Acute respiratory failure with hypoxia: Secondary | ICD-10-CM | POA: Diagnosis not present

## 2016-02-09 DIAGNOSIS — Z7901 Long term (current) use of anticoagulants: Secondary | ICD-10-CM | POA: Diagnosis not present

## 2016-02-09 DIAGNOSIS — I272 Other secondary pulmonary hypertension: Secondary | ICD-10-CM | POA: Diagnosis not present

## 2016-02-09 DIAGNOSIS — R001 Bradycardia, unspecified: Secondary | ICD-10-CM | POA: Diagnosis not present

## 2016-02-09 DIAGNOSIS — I350 Nonrheumatic aortic (valve) stenosis: Secondary | ICD-10-CM | POA: Diagnosis not present

## 2016-02-09 DIAGNOSIS — J45909 Unspecified asthma, uncomplicated: Secondary | ICD-10-CM | POA: Diagnosis not present

## 2016-02-09 DIAGNOSIS — E1122 Type 2 diabetes mellitus with diabetic chronic kidney disease: Secondary | ICD-10-CM | POA: Diagnosis not present

## 2016-02-09 DIAGNOSIS — I249 Acute ischemic heart disease, unspecified: Secondary | ICD-10-CM | POA: Diagnosis not present

## 2016-02-09 DIAGNOSIS — E119 Type 2 diabetes mellitus without complications: Secondary | ICD-10-CM | POA: Diagnosis not present

## 2016-02-09 DIAGNOSIS — N183 Chronic kidney disease, stage 3 (moderate): Secondary | ICD-10-CM | POA: Diagnosis not present

## 2016-02-09 DIAGNOSIS — I361 Nonrheumatic tricuspid (valve) insufficiency: Secondary | ICD-10-CM | POA: Diagnosis not present

## 2016-02-09 DIAGNOSIS — E11649 Type 2 diabetes mellitus with hypoglycemia without coma: Secondary | ICD-10-CM | POA: Diagnosis not present

## 2016-02-09 DIAGNOSIS — J810 Acute pulmonary edema: Secondary | ICD-10-CM | POA: Diagnosis not present

## 2016-02-09 DIAGNOSIS — I469 Cardiac arrest, cause unspecified: Secondary | ICD-10-CM | POA: Diagnosis not present

## 2016-02-09 DIAGNOSIS — R55 Syncope and collapse: Secondary | ICD-10-CM | POA: Diagnosis not present

## 2016-02-09 DIAGNOSIS — T501X5A Adverse effect of loop [high-ceiling] diuretics, initial encounter: Secondary | ICD-10-CM | POA: Diagnosis not present

## 2016-02-09 DIAGNOSIS — J811 Chronic pulmonary edema: Secondary | ICD-10-CM | POA: Diagnosis not present

## 2016-02-09 DIAGNOSIS — Z794 Long term (current) use of insulin: Secondary | ICD-10-CM | POA: Diagnosis not present

## 2016-02-09 DIAGNOSIS — I4891 Unspecified atrial fibrillation: Secondary | ICD-10-CM | POA: Diagnosis not present

## 2016-02-09 DIAGNOSIS — I509 Heart failure, unspecified: Secondary | ICD-10-CM | POA: Diagnosis not present

## 2016-02-09 DIAGNOSIS — I472 Ventricular tachycardia: Secondary | ICD-10-CM | POA: Diagnosis not present

## 2016-02-09 DIAGNOSIS — D72829 Elevated white blood cell count, unspecified: Secondary | ICD-10-CM | POA: Diagnosis not present

## 2016-02-09 DIAGNOSIS — I130 Hypertensive heart and chronic kidney disease with heart failure and stage 1 through stage 4 chronic kidney disease, or unspecified chronic kidney disease: Secondary | ICD-10-CM | POA: Diagnosis not present

## 2016-02-09 DIAGNOSIS — I10 Essential (primary) hypertension: Secondary | ICD-10-CM | POA: Diagnosis not present

## 2016-02-09 DIAGNOSIS — E876 Hypokalemia: Secondary | ICD-10-CM | POA: Diagnosis not present

## 2016-02-13 ENCOUNTER — Encounter (HOSPITAL_COMMUNITY): Payer: Self-pay | Admitting: *Deleted

## 2016-02-13 ENCOUNTER — Inpatient Hospital Stay (HOSPITAL_COMMUNITY): Payer: Medicare Other

## 2016-02-13 ENCOUNTER — Inpatient Hospital Stay (HOSPITAL_COMMUNITY)
Admission: AD | Admit: 2016-02-13 | Discharge: 2016-02-18 | DRG: 280 | Disposition: A | Discharge status: Skilled Nursing Facility | Payer: Medicare Other | Source: Other Acute Inpatient Hospital | Attending: Internal Medicine | Admitting: Internal Medicine

## 2016-02-13 DIAGNOSIS — H9190 Unspecified hearing loss, unspecified ear: Secondary | ICD-10-CM | POA: Diagnosis not present

## 2016-02-13 DIAGNOSIS — J9601 Acute respiratory failure with hypoxia: Secondary | ICD-10-CM | POA: Diagnosis not present

## 2016-02-13 DIAGNOSIS — Z7901 Long term (current) use of anticoagulants: Secondary | ICD-10-CM

## 2016-02-13 DIAGNOSIS — I214 Non-ST elevation (NSTEMI) myocardial infarction: Secondary | ICD-10-CM | POA: Diagnosis not present

## 2016-02-13 DIAGNOSIS — E11319 Type 2 diabetes mellitus with unspecified diabetic retinopathy without macular edema: Secondary | ICD-10-CM | POA: Diagnosis not present

## 2016-02-13 DIAGNOSIS — E118 Type 2 diabetes mellitus with unspecified complications: Secondary | ICD-10-CM | POA: Diagnosis not present

## 2016-02-13 DIAGNOSIS — E039 Hypothyroidism, unspecified: Secondary | ICD-10-CM | POA: Diagnosis not present

## 2016-02-13 DIAGNOSIS — I480 Paroxysmal atrial fibrillation: Secondary | ICD-10-CM | POA: Diagnosis not present

## 2016-02-13 DIAGNOSIS — G9340 Encephalopathy, unspecified: Secondary | ICD-10-CM | POA: Diagnosis not present

## 2016-02-13 DIAGNOSIS — IMO0001 Reserved for inherently not codable concepts without codable children: Secondary | ICD-10-CM

## 2016-02-13 DIAGNOSIS — E785 Hyperlipidemia, unspecified: Secondary | ICD-10-CM | POA: Diagnosis not present

## 2016-02-13 DIAGNOSIS — J45909 Unspecified asthma, uncomplicated: Secondary | ICD-10-CM | POA: Diagnosis not present

## 2016-02-13 DIAGNOSIS — E11622 Type 2 diabetes mellitus with other skin ulcer: Secondary | ICD-10-CM | POA: Diagnosis present

## 2016-02-13 DIAGNOSIS — J810 Acute pulmonary edema: Secondary | ICD-10-CM | POA: Diagnosis not present

## 2016-02-13 DIAGNOSIS — R079 Chest pain, unspecified: Secondary | ICD-10-CM | POA: Diagnosis not present

## 2016-02-13 DIAGNOSIS — I482 Chronic atrial fibrillation, unspecified: Secondary | ICD-10-CM | POA: Diagnosis present

## 2016-02-13 DIAGNOSIS — I469 Cardiac arrest, cause unspecified: Secondary | ICD-10-CM | POA: Diagnosis not present

## 2016-02-13 DIAGNOSIS — E875 Hyperkalemia: Secondary | ICD-10-CM | POA: Diagnosis not present

## 2016-02-13 DIAGNOSIS — I350 Nonrheumatic aortic (valve) stenosis: Secondary | ICD-10-CM | POA: Diagnosis not present

## 2016-02-13 DIAGNOSIS — I10 Essential (primary) hypertension: Secondary | ICD-10-CM | POA: Diagnosis not present

## 2016-02-13 DIAGNOSIS — I082 Rheumatic disorders of both aortic and tricuspid valves: Secondary | ICD-10-CM | POA: Diagnosis not present

## 2016-02-13 DIAGNOSIS — Z794 Long term (current) use of insulin: Secondary | ICD-10-CM

## 2016-02-13 DIAGNOSIS — E119 Type 2 diabetes mellitus without complications: Secondary | ICD-10-CM | POA: Diagnosis not present

## 2016-02-13 DIAGNOSIS — I129 Hypertensive chronic kidney disease with stage 1 through stage 4 chronic kidney disease, or unspecified chronic kidney disease: Secondary | ICD-10-CM | POA: Diagnosis present

## 2016-02-13 DIAGNOSIS — K5900 Constipation, unspecified: Secondary | ICD-10-CM | POA: Diagnosis not present

## 2016-02-13 DIAGNOSIS — E1122 Type 2 diabetes mellitus with diabetic chronic kidney disease: Secondary | ICD-10-CM | POA: Diagnosis present

## 2016-02-13 DIAGNOSIS — R4182 Altered mental status, unspecified: Secondary | ICD-10-CM

## 2016-02-13 DIAGNOSIS — E038 Other specified hypothyroidism: Secondary | ICD-10-CM | POA: Diagnosis not present

## 2016-02-13 DIAGNOSIS — N184 Chronic kidney disease, stage 4 (severe): Secondary | ICD-10-CM | POA: Diagnosis present

## 2016-02-13 DIAGNOSIS — I4891 Unspecified atrial fibrillation: Secondary | ICD-10-CM | POA: Diagnosis not present

## 2016-02-13 DIAGNOSIS — J811 Chronic pulmonary edema: Secondary | ICD-10-CM | POA: Diagnosis not present

## 2016-02-13 DIAGNOSIS — N179 Acute kidney failure, unspecified: Secondary | ICD-10-CM | POA: Diagnosis not present

## 2016-02-13 DIAGNOSIS — M810 Age-related osteoporosis without current pathological fracture: Secondary | ICD-10-CM | POA: Diagnosis not present

## 2016-02-13 DIAGNOSIS — R05 Cough: Secondary | ICD-10-CM | POA: Diagnosis not present

## 2016-02-13 LAB — URINALYSIS, ROUTINE W REFLEX MICROSCOPIC
BILIRUBIN URINE: NEGATIVE
Glucose, UA: NEGATIVE mg/dL
KETONES UR: NEGATIVE mg/dL
LEUKOCYTES UA: NEGATIVE
NITRITE: NEGATIVE
PH: 5.5 (ref 5.0–8.0)
PROTEIN: NEGATIVE mg/dL
Specific Gravity, Urine: 1.014 (ref 1.005–1.030)

## 2016-02-13 LAB — URINE MICROSCOPIC-ADD ON

## 2016-02-13 LAB — CBC WITH DIFFERENTIAL/PLATELET
BASOS ABS: 0 10*3/uL (ref 0.0–0.1)
BASOS PCT: 0 %
EOS ABS: 0.2 10*3/uL (ref 0.0–0.7)
Eosinophils Relative: 2 %
HCT: 49.8 % — ABNORMAL HIGH (ref 36.0–46.0)
Hemoglobin: 16.8 g/dL — ABNORMAL HIGH (ref 12.0–15.0)
Lymphocytes Relative: 12 %
Lymphs Abs: 1.2 10*3/uL (ref 0.7–4.0)
MCH: 31.6 pg (ref 26.0–34.0)
MCHC: 33.7 g/dL (ref 30.0–36.0)
MCV: 93.8 fL (ref 78.0–100.0)
MONO ABS: 0.8 10*3/uL (ref 0.1–1.0)
MONOS PCT: 9 %
Neutro Abs: 7.2 10*3/uL (ref 1.7–7.7)
Neutrophils Relative %: 77 %
PLATELETS: 248 10*3/uL (ref 150–400)
RBC: 5.31 MIL/uL — ABNORMAL HIGH (ref 3.87–5.11)
RDW: 14.3 % (ref 11.5–15.5)
WBC: 9.4 10*3/uL (ref 4.0–10.5)

## 2016-02-13 LAB — COMPREHENSIVE METABOLIC PANEL
ALBUMIN: 3 g/dL — AB (ref 3.5–5.0)
ALK PHOS: 89 U/L (ref 38–126)
ALT: 26 U/L (ref 14–54)
ANION GAP: 14 (ref 5–15)
AST: 34 U/L (ref 15–41)
BILIRUBIN TOTAL: 1.6 mg/dL — AB (ref 0.3–1.2)
BUN: 51 mg/dL — AB (ref 6–20)
CALCIUM: 9.3 mg/dL (ref 8.9–10.3)
CO2: 33 mmol/L — ABNORMAL HIGH (ref 22–32)
Chloride: 86 mmol/L — ABNORMAL LOW (ref 101–111)
Creatinine, Ser: 2.31 mg/dL — ABNORMAL HIGH (ref 0.44–1.00)
GFR calc Af Amer: 22 mL/min — ABNORMAL LOW (ref >60)
GFR, EST NON AFRICAN AMERICAN: 19 mL/min — AB (ref >60)
GLUCOSE: 303 mg/dL — AB (ref 65–99)
POTASSIUM: 5.3 mmol/L — AB (ref 3.5–5.1)
Sodium: 133 mmol/L — ABNORMAL LOW (ref 135–145)
TOTAL PROTEIN: 7.2 g/dL (ref 6.5–8.1)

## 2016-02-13 LAB — MRSA PCR SCREENING: MRSA BY PCR: POSITIVE — AB

## 2016-02-13 LAB — GLUCOSE, CAPILLARY
GLUCOSE-CAPILLARY: 433 mg/dL — AB (ref 65–99)
GLUCOSE-CAPILLARY: 339 mg/dL — AB (ref 65–99)

## 2016-02-13 LAB — PHOSPHORUS: PHOSPHORUS: 3.2 mg/dL (ref 2.5–4.6)

## 2016-02-13 MED ORDER — SODIUM CHLORIDE 0.9% FLUSH: 3.0000 mL | INTRAVENOUS | Status: DC | PRN

## 2016-02-13 MED ORDER — ACETAMINOPHEN 325 MG PO TABS: 650.0000 mg | ORAL_TABLET | Freq: Four times a day (QID) | ORAL | Status: DC | PRN

## 2016-02-13 MED ORDER — METOPROLOL TARTRATE 25 MG PO TABS
25.0000 mg | ORAL_TABLET | Freq: Three times a day (TID) | ORAL | Status: DC
  Administered 2016-02-13 – 2016-02-16 (×7): 25 mg via ORAL
  Filled 2016-02-13 (×8): qty 1

## 2016-02-13 MED ORDER — MULTIVITAMINS PO CAPS: 1.0000 | ORAL_CAPSULE | Freq: Every day | ORAL | Status: DC

## 2016-02-13 MED ORDER — TEMAZEPAM 15 MG PO CAPS: 15.0000 mg | ORAL_CAPSULE | Freq: Every evening | ORAL | Status: DC | PRN

## 2016-02-13 MED ORDER — SODIUM CHLORIDE 0.9 % IV SOLN: 250.0000 mL | INTRAVENOUS | Status: DC | PRN

## 2016-02-13 MED ORDER — POLYETHYLENE GLYCOL 3350 17 G PO PACK
17.0000 g | PACK | Freq: Every day | ORAL | Status: DC
Start: 2016-02-13 — End: 2016-02-18
  Administered 2016-02-14 – 2016-02-18 (×4): 17 g via ORAL
  Filled 2016-02-13 (×4): qty 1

## 2016-02-13 MED ORDER — BISACODYL 5 MG PO TBEC: 10.0000 mg | DELAYED_RELEASE_TABLET | Freq: Every day | ORAL | Status: DC | PRN

## 2016-02-13 MED ORDER — SODIUM CHLORIDE 0.9% FLUSH
3.0000 mL | Freq: Two times a day (BID) | INTRAVENOUS | Status: DC
  Administered 2016-02-13 – 2016-02-18 (×5): 3 mL via INTRAVENOUS

## 2016-02-13 MED ORDER — ALBUTEROL SULFATE (2.5 MG/3ML) 0.083% IN NEBU: 2.5000 mg | INHALATION_SOLUTION | Freq: Four times a day (QID) | RESPIRATORY_TRACT | Status: DC | PRN

## 2016-02-13 MED ORDER — MONTELUKAST SODIUM 10 MG PO TABS
10.0000 mg | ORAL_TABLET | Freq: Every day | ORAL | Status: DC
  Administered 2016-02-13 – 2016-02-14 (×2): 10 mg via ORAL
  Filled 2016-02-13 (×2): qty 1

## 2016-02-13 MED ORDER — ADULT MULTIVITAMIN W/MINERALS CH
1.0000 | ORAL_TABLET | Freq: Every day | ORAL | Status: DC
  Administered 2016-02-14 – 2016-02-18 (×4): 1 via ORAL
  Filled 2016-02-13 (×5): qty 1

## 2016-02-13 MED ORDER — APIXABAN 2.5 MG PO TABS
2.5000 mg | ORAL_TABLET | Freq: Two times a day (BID) | ORAL | Status: DC
  Administered 2016-02-13 – 2016-02-18 (×8): 2.5 mg via ORAL
  Filled 2016-02-13 (×9): qty 1

## 2016-02-13 MED ORDER — ACETAMINOPHEN 650 MG RE SUPP: 650.0000 mg | Freq: Four times a day (QID) | RECTAL | Status: DC | PRN

## 2016-02-13 MED ORDER — LISINOPRIL 10 MG PO TABS: 10.0000 mg | ORAL_TABLET | Freq: Every day | ORAL | Status: DC

## 2016-02-13 MED ORDER — DOCUSATE SODIUM 100 MG PO CAPS
100.0000 mg | ORAL_CAPSULE | Freq: Two times a day (BID) | ORAL | Status: DC
  Administered 2016-02-13 – 2016-02-18 (×8): 100 mg via ORAL
  Filled 2016-02-13 (×9): qty 1

## 2016-02-13 MED ORDER — INSULIN DETEMIR 100 UNIT/ML ~~LOC~~ SOLN
12.0000 [IU] | Freq: Every day | SUBCUTANEOUS | Status: DC
  Administered 2016-02-13: 12 [IU] via SUBCUTANEOUS
  Filled 2016-02-13 (×2): qty 0.12

## 2016-02-13 MED ORDER — ATORVASTATIN CALCIUM 40 MG PO TABS
40.0000 mg | ORAL_TABLET | Freq: Every day | ORAL | Status: DC
  Administered 2016-02-14 – 2016-02-18 (×4): 40 mg via ORAL
  Filled 2016-02-13 (×5): qty 1

## 2016-02-13 MED ORDER — SODIUM CHLORIDE 0.9% FLUSH
3.0000 mL | Freq: Two times a day (BID) | INTRAVENOUS | Status: DC
  Administered 2016-02-13 – 2016-02-16 (×6): 3 mL via INTRAVENOUS

## 2016-02-13 MED ORDER — INSULIN ASPART 100 UNIT/ML ~~LOC~~ SOLN
0.0000 [IU] | Freq: Three times a day (TID) | SUBCUTANEOUS | Status: DC
  Administered 2016-02-14 – 2016-02-15 (×2): 11 [IU] via SUBCUTANEOUS
  Administered 2016-02-16: 3 [IU] via SUBCUTANEOUS
  Administered 2016-02-16: 15 [IU] via SUBCUTANEOUS
  Administered 2016-02-16: 8 [IU] via SUBCUTANEOUS
  Administered 2016-02-17: 5 [IU] via SUBCUTANEOUS
  Administered 2016-02-17: 15 [IU] via SUBCUTANEOUS
  Administered 2016-02-18: 11 [IU] via SUBCUTANEOUS
  Administered 2016-02-18: 15 [IU] via SUBCUTANEOUS

## 2016-02-13 MED ORDER — INSULIN ASPART 100 UNIT/ML ~~LOC~~ SOLN
0.0000 [IU] | Freq: Every day | SUBCUTANEOUS | Status: DC
  Administered 2016-02-13: 5 [IU] via SUBCUTANEOUS
  Administered 2016-02-14: 2 [IU] via SUBCUTANEOUS
  Administered 2016-02-16: 3 [IU] via SUBCUTANEOUS
  Administered 2016-02-17: 4 [IU] via SUBCUTANEOUS

## 2016-02-13 MED ORDER — FUROSEMIDE 40 MG PO TABS
40.0000 mg | ORAL_TABLET | Freq: Every day | ORAL | Status: DC
  Administered 2016-02-14 – 2016-02-18 (×4): 40 mg via ORAL
  Filled 2016-02-13 (×5): qty 1

## 2016-02-13 MED ORDER — ONDANSETRON HCL 4 MG/2ML IJ SOLN: 4.0000 mg | Freq: Four times a day (QID) | INTRAMUSCULAR | Status: DC | PRN

## 2016-02-13 MED ORDER — POTASSIUM CHLORIDE CRYS ER 10 MEQ PO TBCR
40.0000 meq | EXTENDED_RELEASE_TABLET | Freq: Every day | ORAL | Status: DC
  Administered 2016-02-14 – 2016-02-17 (×3): 40 meq via ORAL
  Filled 2016-02-13 (×7): qty 4

## 2016-02-13 MED ORDER — LEVOTHYROXINE SODIUM 75 MCG PO TABS
150.0000 ug | ORAL_TABLET | Freq: Every day | ORAL | Status: DC
  Administered 2016-02-14 – 2016-02-18 (×4): 150 ug via ORAL
  Filled 2016-02-13 (×6): qty 2

## 2016-02-13 MED ORDER — MAGNESIUM HYDROXIDE 400 MG/5ML PO SUSP: 30.0000 mL | Freq: Every day | ORAL | Status: DC | PRN

## 2016-02-13 NOTE — Progress Notes (Signed)
Ann Pastelom Callahan NP paged to make aware that patient's QTC on EKG is [name] [name]

## 2016-02-13 NOTE — H&P (Signed)
[facility] History and Physical  Ann [name] UJW:[id number] DOB: 1933-08-22 DOA: 02/13/2016  Referring physician: not sure PCP: Ann Baars, MD   Chief Complaint: Atrial fib, transferred from [facility] hospital  HPI: Ann [name] is a 82 y.o. female with history of IDDM x 40 yrs w retinopathy admitted w seizure-like episode to [facility] Sat 5/13.  In ED had afib w RVR 160-180's.  Underwent DCCV x 2 and converted to NSR. Admitted to ICU, had acute pulm edema started on IV lasix and Bipap. BP's were high. ECHO showed good EF.  IV amio started and converted to NSR.  Beta blocker held due to low HR.  Then on 5/15 while in ICU had some short runs of VT/ VF then had code blue.  Had brief CPR and had return of BP/ HR with afib.  IV amio, lasix were resumed. F/U CXR showed clearing of edema.  Had refractory afib despite IV amio, diltiazem and MTP.  Discussions were held and decision made to transfer to [facility] for cardiology expertise.  Other issues were creat 1.5 on admission, mild ^WBC rx w IV abx but no clear source and CXR cleared up completely w diuresis. Constipation.   Today patient is w/o complaint, in NSR, no cough, SOB or leg edema.  No chest pain.  Denies hx of CAD/ stent/ heart cath, no hx CVA.  Chronic afib was on coumadin, now on Eliquis for 2-3 yrs for convenience. Hx TAH and sinus surgery.  Allergy to PCN causes nausea only, no rash/ edema/ SOB/ anaphylaxis.  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  HOH.  IDDM x 40 yrs, laser Rx both eyes, no neuropathy.  Hx diabetic leg ulcer left calf which has healed over.    No tob Ann [name].  Lives w husband in [place].  3 grown children in [place], one in [place].  Patient worked for [employer] [place] until 1980's worked at [employer] facility [phone] and [phone]. Retired 1994 to care for grandchildren.  ROS  denies CP  no joint pain   no HA  no blurry vision  no rash  no diarrhea  no nausea/ vomiting  no dysuria  no difficulty  voiding  no change in urine color    Where does patient live home  Can patient participate in ADLs? Yes, pta  Past Medical History  Past Medical History  Diagnosis Date  . Dyslipidemia   . Osteoporosis, unspecified   . Unspecified hypothyroidism   . Unspecified essential hypertension   . Type I (juvenile type) diabetes mellitus without mention of complication, not stated as uncontrolled   . Atrial fibrillation   . Allergic rhinitis, cause unspecified    Past Surgical History  Past Surgical History  Procedure Laterality Date  . Vesicovaginal fistula closure w/ tah    . Nasal sinus surgery     Family History  Family History  Problem Relation Age of Onset  . Asthma     Social History  reports that she has quit smoking. She does not have any smokeless tobacco history on file. Her alcohol and drug histories are not on file. Allergies  Allergies  Allergen Reactions  . Penicillins    Home medications Prior to Admission medications   Medication Sig Start Date End Date Taking? Authorizing Provider  albuterol (PROVENTIL HFA;VENTOLIN HFA) 108 (90 BASE) MCG/ACT inhaler Inhale 2 puffs into the lungs every 6 (six) hours as needed for wheezing. 01/13/12 01/12/13  Waymon Budge, MD  atorvastatin (  LIPITOR) 20 MG tablet Take 20 mg by mouth daily.      Historical Provider, MD  Calcium Carbonate-Vitamin D (SM CALCIUM-VITAMIN D) 600-400 MG-UNIT per tablet Take 1 tablet by mouth daily.      Historical Provider, MD  diltiazem (CARDIZEM CD) 240 MG 24 hr capsule Take 240 mg by mouth daily.      Historical Provider, MD  fluticasone (VERAMYST) 27.5 MCG/SPRAY nasal spray 1-2 sprays each nostril daily 05/05/11   Waymon Budgelinton D Young, MD  glucose blood test strip 1 each by Other route as needed. Use as instructed     Historical Provider, MD  guanFACINE (TENEX) 2 MG tablet Take 2 mg by mouth 2 (two) times daily.      Historical Provider, MD  insulin lispro (HUMALOG) 100 UNIT/ML injection Inject into the skin.  Sliding scale     Historical Provider, MD  LEVEMIR FLEXPEN 100 UNIT/ML injection Inject 24 Units into the skin At bedtime. 02/19/11   Historical Provider, MD  levothyroxine (SYNTHROID, LEVOTHROID) 150 MCG tablet Take 150 mcg by mouth daily.      Historical Provider, MD  lisinopril-hydrochlorothiazide (PRINZIDE,ZESTORETIC) 20-12.5 MG per tablet Take 1 tablet by mouth daily.      Historical Provider, MD  Multiple Vitamin (MULTIVITAMIN) capsule Take 1 capsule by mouth daily.      Historical Provider, MD  ranitidine (ZANTAC) 150 MG tablet Take 1 tablet by mouth Once daily as needed. 02/09/11   Historical Provider, MD  warfarin (COUMADIN) 5 MG tablet Take 5 mg by mouth daily.      Historical Provider, MD   Liver Function Tests No results for input(s): AST, ALT, ALKPHOS, BILITOT, PROT, ALBUMIN in the last 168 hours. No results for input(s): LIPASE, AMYLASE in the last 168 hours. CBC No results for input(s): WBC, NEUTROABS, HGB, HCT, MCV, PLT in the last 168 hours. Basic Metabolic Panel No results for input(s): NA, K, CL, CO2, GLUCOSE, BUN, CREATININE, CALCIUM, PHOS in the last 168 hours.  Invalid input(s): ALB   Filed Vitals:   02/13/16 1850  BP: 163/62  Temp: 98.9 F (37.2 C)  TempSrc: Oral  Resp: 18  Height: 5\' 6"  (1.676 m)  SpO2: 100%   Exam: VSS  160/62,  HR 80's , temp normal Gen alert, HOH, no distress, elderly WF No rash, cyanosis or gangrene Sclera anicteric, throat clear  No jvd or bruits Chest clear bilat RRR 2/6 SEM no RG Abd soft ntnd no mass or ascites +bs mod obese GU foley draining clear urine MS no joint effusions or deformity Ext no LE or UE edema / no wounds or ulcers / +brawny skin changes bilat LE's below mid calf Neuro is alert, Ox 3 , nf    EKG (independently reviewed) > pending CXR (independently reviewed) > pending  Labs - pending   Assessment: 1. Atrial fib with RVR/ chronic afib - [facility]  Admit complicated by brief cardiac  arrest, pulm edema and refractory afib.  [facility].  Is in NSR now. On Eliquis x 2-3 yrs.  No hx current or past MI. Acute pulm edema resolved w diuresis.  On po amio, MTP now. Need to call cardiology in am.  2. HTN - lisinopril and MTP now 3. Cardiac arrest - in setting of pulm edema/ NSVT, brief CPR 4. Acute pulm edema - resolving by CXR per dc summ 5. Constipation  6. IDDM x 40 yrs - cont Levemir and SSI 7. CKD -  labs here pending, creat "1.5" from dc summ. No old labs here.  8. Hard of hearing 9. 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 Full code - d/w husband and pt briefly 11. Asthma - singulair/ nebs prn   Plan - as above   DVT Prophylaxis on Eliquis  Code Status: full  Family Communication: husband here  Disposition Plan: home when better    Maree Krabbe [facility] Pager [phone]  Cell [phone]  If 7PM-7AM, please contact night-coverage [email or web address] Password [facility] 02/13/2016, 7:37 PM

## 2016-02-14 ENCOUNTER — Inpatient Hospital Stay (HOSPITAL_COMMUNITY): Payer: Medicare Other

## 2016-02-14 DIAGNOSIS — N184 Chronic kidney disease, stage 4 (severe): Secondary | ICD-10-CM

## 2016-02-14 DIAGNOSIS — I482 Chronic atrial fibrillation: Secondary | ICD-10-CM

## 2016-02-14 DIAGNOSIS — I4891 Unspecified atrial fibrillation: Secondary | ICD-10-CM

## 2016-02-14 DIAGNOSIS — E119 Type 2 diabetes mellitus without complications: Secondary | ICD-10-CM

## 2016-02-14 DIAGNOSIS — Z794 Long term (current) use of insulin: Secondary | ICD-10-CM

## 2016-02-14 LAB — GLUCOSE, CAPILLARY
GLUCOSE-CAPILLARY: 205 mg/dL — AB (ref 65–99)
GLUCOSE-CAPILLARY: 246 mg/dL — AB (ref 65–99)
Glucose-Capillary: 310 mg/dL — ABNORMAL HIGH (ref 65–99)
Glucose-Capillary: 439 mg/dL — ABNORMAL HIGH (ref 65–99)

## 2016-02-14 LAB — BASIC METABOLIC PANEL
Anion gap: 12 (ref 5–15)
BUN: 52 mg/dL — ABNORMAL HIGH (ref 6–20)
CALCIUM: 9.4 mg/dL (ref 8.9–10.3)
CO2: 32 mmol/L (ref 22–32)
CREATININE: 2 mg/dL — AB (ref 0.44–1.00)
Chloride: 92 mmol/L — ABNORMAL LOW (ref 101–111)
GFR calc non Af Amer: 22 mL/min — ABNORMAL LOW (ref >60)
GFR, EST AFRICAN AMERICAN: 26 mL/min — AB (ref >60)
Glucose, Bld: 224 mg/dL — ABNORMAL HIGH (ref 65–99)
Potassium: 4.4 mmol/L (ref 3.5–5.1)
SODIUM: 136 mmol/L (ref 135–145)

## 2016-02-14 MED ORDER — MUPIROCIN 2 % EX OINT
1.0000 "application " | TOPICAL_OINTMENT | Freq: Two times a day (BID) | CUTANEOUS | Status: DC
  Administered 2016-02-14 – 2016-02-18 (×8): 1 via NASAL
  Filled 2016-02-14 (×2): qty 22

## 2016-02-14 MED ORDER — INSULIN ASPART 100 UNIT/ML ~~LOC~~ SOLN
15.0000 [IU] | Freq: Once | SUBCUTANEOUS | Status: AC
  Administered 2016-02-14: 15 [IU] via SUBCUTANEOUS

## 2016-02-14 MED ORDER — CHLORHEXIDINE GLUCONATE CLOTH 2 % EX PADS
6.0000 | MEDICATED_PAD | Freq: Every day | CUTANEOUS | Status: AC
  Administered 2016-02-14 – 2016-02-18 (×5): 6 via TOPICAL

## 2016-02-14 MED ORDER — GLUCERNA SHAKE PO LIQD
237.0000 mL | Freq: Two times a day (BID) | ORAL | Status: DC
  Administered 2016-02-14 – 2016-02-18 (×5): 237 mL via ORAL

## 2016-02-14 MED ORDER — INSULIN DETEMIR 100 UNIT/ML ~~LOC~~ SOLN
18.0000 [IU] | Freq: Every day | SUBCUTANEOUS | Status: DC
  Administered 2016-02-14 – 2016-02-16 (×3): 18 [IU] via SUBCUTANEOUS
  Filled 2016-02-14 (×4): qty 0.18

## 2016-02-14 NOTE — Progress Notes (Signed)
Pt is having hallucinations which family stated is new. She is talking about hearing conversations about her being dead. She thinks she has lice, I checked the patients head and couldn't find anything. She is also talking to "someone that has a desk sitting right bedside of her." I paged Dr. Rito EhrlichKrishnan and orders for Head CT received. I did a neuro exam and patient was WNL. All grips and pushes equal, no drift in all extremities. Pupils reactive and equal. Will continue to monitor pt.

## 2016-02-14 NOTE — Progress Notes (Signed)
Inpatient Diabetes Program Recommendations  AACE/ADA: New Consensus Statement on Inpatient Glycemic Control (2015)  Target Ranges:  Prepandial:   less than 140 mg/dL      Peak postprandial:   less than 180 mg/dL (1-2 hours)      Critically ill patients:  140 - 180 mg/dL  Results for Ann [name], Ann [name] (MRN [id number]) as of 02/14/2016 13:06  Ref. Range 02/13/2016 18:53 02/13/2016 20:51 02/14/2016 07:31 02/14/2016 11:09  Glucose-Capillary Latest Ref Range: 65-99 mg/dL 409433 ([name]) 811339 ([name]) 914205 ([name]) 310 ([name])   Review of Glycemic Control  Diabetes history: DM Outpatient Diabetes medications: Levemir 24 units q hs + Humalog correction scale tid with meals Current orders for Inpatient glycemic control: Levemir 18 units + Novolog correction scale 0-15 units tid + 0-5 units hs  Inpatient Diabetes Program Recommendations:  Noted elevated post prandial CBGs. Please consider meal coverage Novolog 4 units tid (hold if eats <50%).  Thank you, Billy FischerJudy E. Lonia Roane, RN, MSN, CDE Inpatient Glycemic Control Team Team Pager [phone] (8am-5pm) 02/14/2016 1:08 PM

## 2016-02-14 NOTE — Discharge Instructions (Addendum)
°Information on my medicine - ELIQUIS® (apixaban) ° °This medication education was reviewed with me or my healthcare representative as part of my discharge preparation.   ° °Why was Eliquis® prescribed for you? °Eliquis® was prescribed for you to reduce the risk of a blood clot forming that can cause a stroke if you have a medical condition called atrial fibrillation (a type of irregular heartbeat). ° °What do You need to know about Eliquis® ? °Take your Eliquis® TWICE DAILY - one tablet in the morning and one tablet in the evening with or without food. If you have difficulty swallowing the tablet whole please discuss with your pharmacist how to take the medication safely. ° °Take Eliquis® exactly as prescribed by your doctor and DO NOT stop taking Eliquis® without talking to the doctor who prescribed the medication.  Stopping may increase your risk of developing a stroke.  Refill your prescription before you run out. ° °After discharge, you should have regular check-up appointments with your healthcare provider that is prescribing your Eliquis®.  In the future your dose may need to be changed if your kidney function or weight changes by a significant amount or as you get older. ° °What do you do if you miss a dose? °If you miss a dose, take it as soon as you remember on the same day and resume taking twice daily.  Do not take more than one dose of ELIQUIS at the same time to make up a missed dose. ° °Important Safety Information °A possible side effect of Eliquis® is bleeding. You should call your healthcare provider right away if you experience any of the following: °? Bleeding from an injury or your nose that does not stop. °? Unusual colored urine (red or dark brown) or unusual colored stools (red or black). °? Unusual bruising for unknown reasons. °? A serious fall or if you hit your head (even if there is no bleeding). ° °Some medicines may interact with Eliquis® and might increase your risk of bleeding or  clotting while on Eliquis®. To help avoid this, consult your healthcare provider or pharmacist prior to using any new prescription or non-prescription medications, including herbals, vitamins, non-steroidal anti-inflammatory drugs (NSAIDs) and supplements. ° °This website has more information on Eliquis® (apixaban): http://www.eliquis.com/eliquis/home ° °Atrial Fibrillation °Atrial fibrillation is a type of irregular or rapid heartbeat (arrhythmia). In atrial fibrillation, the heart quivers continuously in a chaotic pattern. This occurs when parts of the heart receive disorganized signals that make the heart unable to pump blood normally. This can increase the risk for stroke, heart failure, and other heart-related conditions. There are different types of atrial fibrillation, including: °· Paroxysmal atrial fibrillation. This type starts suddenly, and it usually stops on its own shortly after it starts. °· Persistent atrial fibrillation. This type often lasts longer than a week. It may stop on its own or with treatment. °· Long-lasting persistent atrial fibrillation. This type lasts longer than 12 months. °· Permanent atrial fibrillation. This type does not go away. °Talk with your health care provider to learn about the type of atrial fibrillation that you have. °CAUSES °This condition is caused by some heart-related conditions or procedures, including: °· A heart attack. °· Coronary artery disease. °· Heart failure. °· Heart valve conditions. °· High blood pressure. °· Inflammation of the sac that surrounds the heart (pericarditis). °· Heart surgery. °· Certain heart rhythm disorders, such as Wolf-Parkinson-White syndrome. °Other causes include: °· Pneumonia. °· Obstructive sleep apnea. °· Blockage of an artery   in the lungs (pulmonary embolism, or PE). °· Lung cancer. °· Chronic lung disease. °· Thyroid problems, especially if the thyroid is overactive (hyperthyroidism). °· Caffeine. °· Excessive alcohol use or  illegal drug use. °· Use of some medicines, including certain decongestants and diet pills. °Sometimes, the cause cannot be found. °RISK FACTORS °This condition is more likely to develop in: °· People who are older in age. °· People who smoke. °· People who have diabetes mellitus. °· People who are overweight (obese). °· Athletes who exercise vigorously. °SYMPTOMS °Symptoms of this condition include: °· A feeling that your heart is beating rapidly or irregularly. °· A feeling of discomfort or pain in your chest. °· Shortness of breath. °· Sudden light-headedness or weakness. °· Getting tired easily during exercise. °In some cases, there are no symptoms. °DIAGNOSIS °Your health care provider may be able to detect atrial fibrillation when taking your pulse. If detected, this condition may be diagnosed with: °· An electrocardiogram (ECG). °· A Holter monitor test that records your heartbeat patterns over a 24-hour period. °· Transthoracic echocardiogram (TTE) to evaluate how blood flows through your heart. °· Transesophageal echocardiogram (TEE) to view more detailed images of your heart. °· A stress test. °· Imaging tests, such as a CT scan or chest X-ray. °· Blood tests. °TREATMENT °The main goals of treatment are to prevent blood clots from forming and to keep your heart beating at a normal rate and rhythm. The type of treatment that you receive depends on many factors, such as your underlying medical conditions and how you feel when you are experiencing atrial fibrillation. °This condition may be treated with: °· Medicine to slow down the heart rate, bring the heart's rhythm back to normal, or prevent clots from forming. °· Electrical cardioversion. This is a procedure that resets your heart's rhythm by delivering a controlled, low-energy shock to the heart through your skin. °· Different types of ablation, such as catheter ablation, catheter ablation with pacemaker, or surgical ablation. These procedures destroy  the heart tissues that send abnormal signals. When the pacemaker is used, it is placed under your skin to help your heart beat in a regular rhythm. °HOME CARE INSTRUCTIONS °· Take over-the counter and prescription medicines only as told by your health care provider. °· If your health care provider prescribed a blood-thinning medicine (anticoagulant), take it exactly as told. Taking too much blood-thinning medicine can cause bleeding. If you do not take enough blood-thinning medicine, you will not have the protection that you need against stroke and other problems. °· Do not use tobacco products, including cigarettes, chewing tobacco, and e-cigarettes. If you need help quitting, ask your health care provider. °· If you have obstructive sleep apnea, manage your condition as told by your health care provider. °· Do not drink alcohol. °· Do not drink beverages that contain caffeine, such as coffee, soda, and tea. °· Maintain a healthy weight. Do not use diet pills unless your health care provider approves. Diet pills may make heart problems worse. °· Follow diet instructions as told by your health care provider. °· Exercise regularly as told by your health care provider. °· Keep all follow-up visits as told by your health care provider. This is important. °PREVENTION °· Avoid drinking beverages that contain caffeine or alcohol. °· Avoid certain medicines, especially medicines that are used for breathing problems. °· Avoid certain herbs and herbal medicines, such as those that contain ephedra or ginseng. °· Do not use illegal drugs, such as cocaine and   amphetamines. °· Do not smoke. °· Manage your high blood pressure. °SEEK MEDICAL CARE IF: °· You notice a change in the rate, rhythm, or strength of your heartbeat. °· You are taking an anticoagulant and you notice increased bruising. °· You tire more easily when you exercise or exert yourself. °SEEK IMMEDIATE MEDICAL CARE IF: °· You have chest pain, abdominal pain,  sweating, or weakness. °· You feel nauseous. °· You notice blood in your vomit, bowel movement, or urine. °· You have shortness of breath. °· You suddenly have swollen feet and ankles. °· You feel dizzy. °· You have sudden weakness or numbness of the face, arm, or leg, especially on one side of the body. °· You have trouble speaking, trouble understanding, or both (aphasia). °· Your face or your eyelid droops on one side. °These symptoms may represent a serious problem that is an emergency. Do not wait to see if the symptoms will go away. Get medical help right away. Call your local emergency services (911 in the [place] Do not drive yourself to the hospital. °  °This information is not intended to replace advice given to you by your health care provider. Make sure you discuss any questions you have with your health care provider. °  °Document Released: 09/14/2005 Document Revised: 06/05/2015 Document Reviewed: 01/09/2015 °Elsevier Interactive Patient Education ©2016 [employer] ° °

## 2016-02-14 NOTE — Progress Notes (Signed)
Initial Nutrition Assessment  DOCUMENTATION CODES:   Not applicable  INTERVENTION:    Glucerna Shake po TID, each supplement provides 220 kcal and 10 grams of protein  NUTRITION DIAGNOSIS:   Inadequate oral intake related to lethargy/confusion as evidenced by other (see comment) (RN report).  GOAL:   Patient will meet greater than or equal to 90% of their needs  MONITOR:   PO intake, Supplement acceptance, Labs, Weight trends, I & O's  REASON FOR ASSESSMENT:   Malnutrition Screening Tool    ASSESSMENT:   82 y.o. female with history of IDDM x 40 yrs w retinopathy admitted w seizure-like episode [facility] 5/13.At [facility], on 5/15 while in ICU had some short runs of VT/ VF then had code blue. Had brief CPR and had return of BP/ HR with afib.Discussions were held and decision made to transfer to [facility] for cardiology expertise.  Patient not in room during RD visit. Per RN, she has gone for head scan due to new onset hallucinations. Suspect patient has been eating poorly given hospitalization in the ICU since 5/13. Weight loss was reported on admission nutrition screen, amount not specified.  Unable to complete Nutrition-Focused physical exam at this time.   Diet Order:  Diet Carb Modified Fluid consistency:: Thin; Room service appropriate?: Yes  Skin:  Reviewed, no issues  Last BM:  5/18  Height:   Ht Readings from Last 1 Encounters:  02/13/16 5\' 6"  (1.676 m)    Weight:   Wt Readings from Last 1 Encounters:  02/14/16 159 lb 2.8 oz (72.2 kg)    Ideal Body Weight:  59.1 kg  BMI:  Body mass index is 25.7 kg/(m^2).  Estimated Nutritional Needs:   Kcal:  1600-1800  Protein:  80-95 gm  Fluid:  1.8 L  EDUCATION NEEDS:   No education needs identified at this time  Joaquin CourtsKimberly Woodfin Kiss, RD, LDN, CNSC Pager [phone] After Hours Pager [phone]

## 2016-02-14 NOTE — Progress Notes (Addendum)
[facility] PROGRESS NOTE  Lorenso QuarryMolly H Riviere ZOX:[id number] DOB: 12/22/1932 DOA: 02/13/2016  PCP: Marlow BaarsKendall Garing, MD  Brief HPI: 82 year old Caucasian female with a past medical history of insulin-dependent diabetes, atrial fibrillation on anticoagulation, hypothyroidism, presented to the emergency department at [facility] on Saturday, May 13 with a seizure-like episode. She was found to have atrial fibrillation with RVR at a rate of 160. She was admitted to the hospital. She underwent cardioversion and converted to sinus rhythm. She was noted to have pulmonary edema and was started on Lasix. She was seen by cardiology [facility] at [facility]. She was also given amiodarone due to recurrence of A. fib and she converted to sinus rhythm. And then she had an episode of cardiac arrest with the VT/VF. ACLS protocol was followed and the patient had return of spontaneous circulation. Discussions were held with electrophysiologist here at [facility]. The patient was transferred for further workup.  Past medical history:  Past Medical History  Diagnosis Date  . Dyslipidemia   . Osteoporosis, unspecified   . Unspecified hypothyroidism   . Unspecified essential hypertension   . Type I (juvenile type) diabetes mellitus without mention of complication, not stated as uncontrolled   . Atrial fibrillation (HCC)   . Allergic rhinitis, cause unspecified     Consultants: Cardiology  Procedures: None  Antibiotics: None  Subjective: Patient very agitated this morning and confused. She has been hallucinating. She is exhibiting some paranoid behavior. Her daughter-in-law is at the bedside. Patient denies any chest pain or shortness of breath.  Objective:  Vital Signs  Filed Vitals:   02/13/16 1850 02/14/16 0445  BP: 163/62 122/68  Pulse:  63  Temp: 98.9 F (37.2 C) 98.6 F (37 C)  TempSrc: Oral Oral  Resp: 18 18  Height: 5\' 6"  (1.676 m)   Weight: 72.4 kg (159 lb 9.8 oz) 72.2 kg  (159 lb 2.8 oz)  SpO2: 100% 94%    Intake/Output Summary (Last 24 hours) at 02/14/16 1153 Last data filed at 02/14/16 [id number]  Gross per 24 hour  Intake    240 ml  Output    860 ml  Net   -620 ml   Filed Weights   02/13/16 1850 02/14/16 0445  Weight: 72.4 kg (159 lb 9.8 oz) 72.2 kg (159 lb 2.8 oz)    General appearance: alert, cooperative, appears stated age and no distress Resp: Diminished air entry at the bases but mostly clear to auscultation. No wheezing, rales or rhonchi. Cardio: regular rate and rhythm, S1, S2 normal, no murmur, click, rub or gallop GI: soft, non-tender; bowel sounds normal; no masses,  no organomegaly Extremities: extremities normal, atraumatic, no cyanosis or edema Neurologic: Awake and alert. Oriented 3. Somewhat distracted at times. Some paranoia is noted. No facial asymmetry. Tongue is midline. Motor strength is equal bilateral upper and lower extremities.  Lab Results:  Data Reviewed: I have personally reviewed following labs and imaging studies  CBC:  Recent Labs Lab 02/13/16 2052  WBC 9.4  NEUTROABS 7.2  HGB 16.8*  HCT 49.8*  MCV 93.8  PLT 248   Basic Metabolic Panel:  Recent Labs Lab 02/13/16 2052 02/14/16 0643  NA 133* 136  K 5.3* 4.4  CL 86* 92*  CO2 33* 32  GLUCOSE 303* 224*  BUN 51* 52*  CREATININE 2.31* 2.00*  CALCIUM 9.3 9.4  PHOS 3.2  --    GFR: Estimated Creatinine Clearance: 22.8 mL/min (by C-G formula based on Cr of 2).  Liver  Function Tests:  Recent Labs Lab 02/13/16 2052  AST 34  ALT 26  ALKPHOS 89  BILITOT 1.6*  PROT 7.2  ALBUMIN 3.0*   CBG:  Recent Labs Lab 02/13/16 1853 02/13/16 2051 02/14/16 0731 02/14/16 1109  GLUCAP 433* 339* 205* 310*   Urine analysis:    Component Value Date/Time   COLORURINE YELLOW 02/13/2016 2234   APPEARANCEUR CLEAR 02/13/2016 2234   LABSPEC 1.014 02/13/2016 2234   PHURINE 5.5 02/13/2016 2234   GLUCOSEU NEGATIVE 02/13/2016 2234   HGBUR MODERATE* 02/13/2016 2234    BILIRUBINUR NEGATIVE 02/13/2016 2234   KETONESUR NEGATIVE 02/13/2016 2234   PROTEINUR NEGATIVE 02/13/2016 2234   NITRITE NEGATIVE 02/13/2016 2234   LEUKOCYTESUR NEGATIVE 02/13/2016 2234    Recent Results (from the past 240 hour(s))  MRSA PCR Screening     Status: Abnormal   Collection Time: 02/13/16  6:53 PM  Result Value Ref Range Status   MRSA by PCR POSITIVE (A) NEGATIVE Final    Comment:        The GeneXpert MRSA Assay (FDA approved for NASAL specimens only), is one component of a comprehensive MRSA colonization surveillance program. It is not intended to diagnose MRSA infection nor to guide or monitor treatment for MRSA infections. RESULT CALLED TO, READ BACK BY AND VERIFIED WITH: H MOOSE RN 2240 02/13/16 A BROWNING       Radiology Studies: Portable Chest 1 View  02/13/2016  CLINICAL DATA:  Atrial fibrillation with rapid ventricular response EXAM: PORTABLE CHEST 1 VIEW COMPARISON:  Portable exam 2032 hours without priors for comparison. FINDINGS: Borderline enlargement of cardiac silhouette. Probable mitral annular calcification. Atherosclerotic calcification aorta. Pulmonary vascularity normal. Bronchitic changes without pulmonary infiltrate, pleural effusion or pneumothorax. Bones unremarkable. IMPRESSION: Borderline enlargement of cardiac silhouette. Bronchitic changes without infiltrate. Electronically Signed   By: Ulyses Southward M.D.   On: 02/13/2016 20:46     Medications:  Scheduled: . apixaban  2.5 mg Oral BID  . atorvastatin  40 mg Oral Daily  . Chlorhexidine Gluconate Cloth  6 each Topical Q0600  . docusate sodium  100 mg Oral BID  . furosemide  40 mg Oral Daily  . insulin aspart  0-15 Units Subcutaneous TID WC  . insulin aspart  0-5 Units Subcutaneous QHS  . insulin detemir  12 Units Subcutaneous QHS  . levothyroxine  150 mcg Oral QAC breakfast  . metoprolol tartrate  25 mg Oral TID  . montelukast  10 mg Oral QHS  . multivitamin with minerals  1 tablet  Oral Daily  . mupirocin ointment  1 application Nasal BID  . polyethylene glycol  17 g Oral Daily  . potassium chloride  40 mEq Oral Daily  . sodium chloride flush  3 mL Intravenous Q12H  . sodium chloride flush  3 mL Intravenous Q12H   Continuous:  ZOX:WRUEAV chloride, acetaminophen **OR** acetaminophen, albuterol, bisacodyl, magnesium hydroxide, ondansetron (ZOFRAN) IV, sodium chloride flush, temazepam  Assessment/Plan:  Active Problems:   Hypothyroidism   Essential hypertension   Chronic atrial fibrillation (HCC)   Asthma   Cardiac arrest (HCC)   Atrial fibrillation with rapid ventricular response (HCC)   Insulin dependent diabetes mellitus (HCC)    Cardiac arrest with VT/VF/elevated troponin This occurred while she was at [facility]. Patient was transferred here for further cardiology input. This episode occurred in the setting of A. fib with RVR and pulmonary edema. Currently, patient is in sinus rhythm. Her troponins were elevated at [facility]. Patient denies any chest pain  currently. Await cardiology input.  Atrial fibrillation, chronic. She has a known history of atrial fibrillation and has been on oral anticoagulation. She was admitted with RVR to [facility] a few days ago. She required cardioversion and then had to be placed on amiodarone with which she converted to sinus rhythm. Continue oral amiodarone for now. She is on oral anticoagulation as well. Echocardiogram was done at [facility] and showed EF of 55-60%. Hypokinetic apical anterior and apical anterior wall was seen. Mild to moderate aortic regurgitation. Mild aortic stenosis was noted. Moderate to severe tricuspid regurgitation was noted. RV systolic pressure was 74 mmHg. Currently is in sinus rhythm. Management per cardiology. She is on metoprolol.  Acute encephalopathy She does not have any focal neurological deficits. However, she does exhibit some paranoid behavior and has been  hallucinating. We'll proceed with CT head for now. Her symptoms could be due to acute hospitalization and cardiac arrest. She could have underlying cognitive impairment. She may need dementia workup. May need to be seen by psychiatry. Will reassess tomorrow.  Acute pulmonary edema Secondary to A. fib with RVR. She was given diuretics with improvement. Continue oral Lasix.  Insulin-dependent diabetes mellitus. Continue long-acting insulin and SSI. Monitor CBGs. CBGs are poorly controlled. Increase the dose of Levemir.  Chronic kidney disease, unknown stage. Apparently her creatinine at the time of her admission to [facility] was about 1.5. It's 2.0 this morning. Continue to monitor urine output. Monitor renal function closely. UA was done at the time of admission and did not suggest any infection. Moderate hemoglobin with 6-30 RBCs. No protein. She was hyperkalemic last night. She is noted to be on ACE inhibitor, which will be held for now. Await stabilization of renal function before resuming ACE inhibitor.  History of essential hypertension Monitor blood pressures closely. Continue current medications.   History of hypothyroidism Check TSH, free T4.  DVT Prophylaxis: On Apixaban    Code Status: Full code  Family Communication: Discussed with the patient and her daughter-in-law  Disposition Plan: Await cardiology input. Await CT head.    LOS: 1 day   [facility]  [facility] Pager [phone] 02/14/2016, 11:53 AM  If 7PM-7AM, please contact night-coverage at [email or web address], password [facility]

## 2016-02-14 NOTE — Consult Note (Signed)
ELECTROPHYSIOLOGY CONSULT NOTE    Patient ID: HASSIE MANDT MRN: [id number], DOB/AGE: 11/13/1932 82 y.o.  Admit date: 02/13/2016 Date of Consult: 02/14/2016  Primary Physician: Marlow Baars, MD Primary Cardiologist: Revenkar Referring Physician: Rito Ehrlich  Reason for Consultation: AF and ?cardiac arrest   HPI:  GRETE BOSKO is a 82 y.o. female with a past medical history significant for persistent atrial fibrillation, hyperlipidemia, hypertension, hypothyroidism, and diabetes.  She was transferred from [facility] yesterday for further evaluation of AF and cardiac arrest (strips not documented).  She is very confused today and is hallucinating.  History is obtained from records from [place], patient's son, and some from patient.    According to the patient's son, she was at home on the day of admission to [facility] 02/09/16 when she had a syncopal event while sitting. She does not remember any prodrome. When she awoke, her husband was on the phone and calling 911.  Her husband is not currently available to get more history.  On EMS arrival, she was in AF with RVR and transported to the hospital. She was cardioverted to SR from AF in the ER and placed on amiodarone.  She was placed on Bipap for a short time for pulmonary edema and diuresed.  She then developed bradycardia and her amiodarone was discontinued. She subsequently had a VF arrest per notes (no strips available).  Per the chart, she received chest compressions for a short time and then had ROSC.  Again, there are not strips available for review. She was placed back on amiodarone and transferred to [facility] for further evaluation.   Troponin was elevated on admission at 2.12 and trended down during hospitalization.  Echocardiogram 02/09/16 demonstrated EF 55-60%, apical anterior and inferior hypokinesis, mild to moderate AR, mild AS, moderate to severe TR.  She is currently confused, but denies chest pain, shortness of breath,  recent fevers, chills, nausea or vomiting. She has not had LE edema. No syncope prior to the day of admission to [facility].   Past Medical History  Diagnosis Date  . Dyslipidemia   . Osteoporosis, unspecified   . Unspecified hypothyroidism   . Unspecified essential hypertension   . Type I (juvenile type) diabetes mellitus without mention of complication, not stated as uncontrolled   . Atrial fibrillation (HCC)   . Allergic rhinitis, cause unspecified      Surgical History:  Past Surgical History  Procedure Laterality Date  . Vesicovaginal fistula closure w/ tah    . Nasal sinus surgery       Prescriptions prior to admission  Medication Sig Dispense Refill Last Dose  . albuterol (PROVENTIL HFA;VENTOLIN HFA) 108 (90 BASE) MCG/ACT inhaler Inhale 2 puffs into the lungs every 6 (six) hours as needed for wheezing. 1 Inhaler 5 Past Month at Unknown time  . atorvastatin (LIPITOR) 20 MG tablet Take 20 mg by mouth daily.     02/13/2016 at Unknown time  . fluticasone (VERAMYST) 27.5 MCG/SPRAY nasal spray 1-2 sprays each nostril daily 10 g 6 Past Week at Unknown time  . glucose blood test strip 1 each by Other route as needed. Use as instructed    02/13/2016 at Unknown time  . insulin lispro (HUMALOG) 100 UNIT/ML injection Inject into the skin. Sliding scale    02/13/2016 at Unknown time  . LEVEMIR FLEXPEN 100 UNIT/ML injection Inject 24 Units into the skin At bedtime.   02/13/2016 at Unknown time  . levothyroxine (SYNTHROID, LEVOTHROID) 150 MCG tablet Take 150 mcg by  mouth daily.     02/13/2016 at Unknown time  . lisinopril-hydrochlorothiazide (PRINZIDE,ZESTORETIC) 20-12.5 MG per tablet Take 1 tablet by mouth daily.     02/13/2016 at Unknown time  . Multiple Vitamin (MULTIVITAMIN) capsule Take 1 capsule by mouth daily.     02/13/2016 at Unknown time  . Calcium Carbonate-Vitamin D (SM CALCIUM-VITAMIN D) 600-400 MG-UNIT per tablet Take 1 tablet by mouth daily.     Unknown at Unknown time  . diltiazem  (CARDIZEM CD) 240 MG 24 hr capsule Take 240 mg by mouth daily.     Unknown at Unknown time  . guanFACINE (TENEX) 2 MG tablet Take 2 mg by mouth 2 (two) times daily.     Unknown at Unknown time  . ranitidine (ZANTAC) 150 MG tablet Take 1 tablet by mouth Once daily as needed.   Unknown at Unknown time    Inpatient Medications:  . apixaban  2.5 mg Oral BID  . atorvastatin  40 mg Oral Daily  . Chlorhexidine Gluconate Cloth  6 each Topical Q0600  . docusate sodium  100 mg Oral BID  . feeding supplement (GLUCERNA SHAKE)  237 mL Oral BID BM  . furosemide  40 mg Oral Daily  . insulin aspart  0-15 Units Subcutaneous TID WC  . insulin aspart  0-5 Units Subcutaneous QHS  . insulin detemir  18 Units Subcutaneous QHS  . levothyroxine  150 mcg Oral QAC breakfast  . metoprolol tartrate  25 mg Oral TID  . montelukast  10 mg Oral QHS  . multivitamin with minerals  1 tablet Oral Daily  . mupirocin ointment  1 application Nasal BID  . polyethylene glycol  17 g Oral Daily  . potassium chloride  40 mEq Oral Daily  . sodium chloride flush  3 mL Intravenous Q12H  . sodium chloride flush  3 mL Intravenous Q12H    Allergies:  Allergies  Allergen Reactions  . Penicillins     Social History   Social History  . Marital Status: Married    Spouse Name: N/A  . Number of Children: N/A  . Years of Education: N/A   Occupational History  . Not on file.   Social History Main Topics  . Smoking status: Former Games developer  . Smokeless tobacco: Not on file  . Alcohol Use: Not on file  . Drug Use: Not on file  . Sexual Activity: Not on file   Other Topics Concern  . Not on file   Social History Narrative     Family History  Problem Relation Age of Onset  . Asthma       Review of Systems: All other systems reviewed and are otherwise negative except as noted above.  Physical Exam: Filed Vitals:   02/14/16 0445 02/14/16 0830 02/14/16 1220 02/14/16 1341  BP: 122/68 158/83 172/81   Pulse: 63       Temp: 98.6 F (37 C)   97.8 F (36.6 C)  TempSrc: Oral   Oral  Resp: 18     Height:      Weight: 159 lb 2.8 oz (72.2 kg)     SpO2: 94%       GEN- The patient is elderly appearing, alert and confused, +hallucinations  HEENT: normocephalic, atraumatic; sclera clear, conjunctiva pink; hearing intact; oropharynx clear; neck supple  Lungs- Clear to ausculation bilaterally, normal work of breathing.  No wheezes, rales, rhonchi Heart- Regular rate and rhythm  GI- soft, non-tender, non-distended, bowel sounds present Extremities- no clubbing, cyanosis,  or edema; DP/PT/radial pulses 2+ bilaterally MS- no significant deformity or atrophy Skin- warm and dry, no rash or lesion Psych- euthymic mood, full affect Neuro- strength and sensation are intact  Labs:   Lab Results  Component Value Date   WBC 9.4 02/13/2016   HGB 16.8* 02/13/2016   HCT 49.8* 02/13/2016   MCV 93.8 02/13/2016   PLT 248 02/13/2016    Recent Labs Lab 02/13/16 2052 02/14/16 0643  NA 133* 136  K 5.3* 4.4  CL 86* 92*  CO2 33* 32  BUN 51* 52*  CREATININE 2.31* 2.00*  CALCIUM 9.3 9.4  PROT 7.2  --   BILITOT 1.6*  --   ALKPHOS 89  --   ALT 26  --   AST 34  --   GLUCOSE 303* 224*      Radiology/Studies: Ct Head Wo Contrast 02/14/2016  CLINICAL DATA:  Altered mental status EXAM: CT HEAD WITHOUT CONTRAST TECHNIQUE: Contiguous axial images were obtained from the base of the skull through the vertex without intravenous contrast. COMPARISON:  None. FINDINGS: No skull fracture is noted. Paranasal sinuses and mastoid air cells are unremarkable. No intracranial hemorrhage, mass effect or midline shift. No acute cortical infarction. No mass lesion is noted on this unenhanced scan. Mild cerebral atrophy. Mild periventricular and patchy subcortical white matter decreased attenuation probable due to chronic small vessel ischemic changes. Mild atherosclerotic calcifications of carotid siphon. No intra or extra-axial fluid  collection. IMPRESSION: No acute intracranial abnormality. Mild cerebral atrophy. Periventricular and patchy subcortical white matter decreased attenuation probable due to chronic small vessel ischemic changes. No definite acute cortical infarction. Electronically Signed   By: Natasha Mead M.D.   On: 02/14/2016 13:23   Portable Chest 1 View 02/13/2016  CLINICAL DATA:  Atrial fibrillation with rapid ventricular response EXAM: PORTABLE CHEST 1 VIEW COMPARISON:  Portable exam 2032 hours without priors for comparison. FINDINGS: Borderline enlargement of cardiac silhouette. Probable mitral annular calcification. Atherosclerotic calcification aorta. Pulmonary vascularity normal. Bronchitic changes without pulmonary infiltrate, pleural effusion or pneumothorax. Bones unremarkable. IMPRESSION: Borderline enlargement of cardiac silhouette. Bronchitic changes without infiltrate. Electronically Signed   By: Ulyses Southward M.D.   On: 02/13/2016 20:46    RUE:AVWUJ rhythm, QTc  TELEMETRY: sinus rhythm, no arrhythmias   Assessment/Plan: 1.  Persistent atrial fibrillation The patient is currently maintaining SR.  Apparently, there has been discussion in the past about possible need for AVN ablation and PPM with her primary cardiology team. With her current state of confusion, recent NSTEMI, and other co-morbidities, she is not a candidate for any EP procedure at this time. Would recommend follow up as an outpatient with her primary cardiology/EP team.  Continue Eliquis for [facility] of 3  2.  NSTEMI Troponin peak at [facility] was >2. Likely related to AF with RVR Dr Johney Frame discussed with Dr Josiah Lobo - would defer ischemic eval at this time Baseline creatinine around 1.6 per labs from [facility] (in paper chart) Continue BB No ASA with need for Eliquis  3.  ?cardiac arrest No strips available for review Her AF was very fast and could have been aberration In the setting of NSTEMI, guidelines  would suggest medical therapy   4.  Prolonged QT Avoid QT prolonging drugs Keep K >3.9 Mg >1.8  5.  Diabetes Per primary team  6.  Acute delirium Per primary team   Signed, Gypsy Balsam, NP 02/14/2016 2:01 PM   I have seen, examined the patient, and reviewed the  above assessment and plan. On exam, confused elderly female. Changes to above are made where necessary.   I have spoken with primary Cardiologist (Dr Wille Glaserevencar) who knows the patient well.  She was admitted with AF with RVR but did convert to sinus with amiodarone.  She has since been in sinus.  Qt is a little long.  I do not believe that she had a ventricular arrhythmia related event and did not require defibrillation.  No indication for EP procedures at this time.  Dr Wille Glaserevencar and I agree that given no symptoms of ischemic, advanced age, and renal failure that cath should also be avoided.  I would recommend caution with eliquis and would switch to coumadin if creatinine worsens further. I would advise restarting amiodarone 200mg  daily.  Observe overnight.  If she remains in sinus, I think that she could be discharged this weekend to follow-up with Dr Wille Glaserevencar next week.    EP to see as needed going forward and general cardiology to follow while here.  Co Sign: Hillis RangeJames Salma Walrond, MD 02/14/2016 6:53 PM

## 2016-02-15 DIAGNOSIS — E038 Other specified hypothyroidism: Secondary | ICD-10-CM

## 2016-02-15 DIAGNOSIS — G9340 Encephalopathy, unspecified: Secondary | ICD-10-CM

## 2016-02-15 LAB — GLUCOSE, CAPILLARY
GLUCOSE-CAPILLARY: 419 mg/dL — AB (ref 65–99)
Glucose-Capillary: 154 mg/dL — ABNORMAL HIGH (ref 65–99)
Glucose-Capillary: 350 mg/dL — ABNORMAL HIGH (ref 65–99)
Glucose-Capillary: 99 mg/dL (ref 65–99)

## 2016-02-15 LAB — CBC
HEMATOCRIT: 49.6 % — AB (ref 36.0–46.0)
Hemoglobin: 16.6 g/dL — ABNORMAL HIGH (ref 12.0–15.0)
MCH: 31.4 pg (ref 26.0–34.0)
MCHC: 33.5 g/dL (ref 30.0–36.0)
MCV: 93.9 fL (ref 78.0–100.0)
PLATELETS: 277 10*3/uL (ref 150–400)
RBC: 5.28 MIL/uL — AB (ref 3.87–5.11)
RDW: 14.1 % (ref 11.5–15.5)
WBC: 10.8 10*3/uL — ABNORMAL HIGH (ref 4.0–10.5)

## 2016-02-15 LAB — COMPREHENSIVE METABOLIC PANEL
ALK PHOS: 85 U/L (ref 38–126)
ALT: 25 U/L (ref 14–54)
AST: 38 U/L (ref 15–41)
Albumin: 3 g/dL — ABNORMAL LOW (ref 3.5–5.0)
Anion gap: 13 (ref 5–15)
BILIRUBIN TOTAL: 1.2 mg/dL (ref 0.3–1.2)
BUN: 53 mg/dL — AB (ref 6–20)
CALCIUM: 9.6 mg/dL (ref 8.9–10.3)
CHLORIDE: 87 mmol/L — AB (ref 101–111)
CO2: 33 mmol/L — ABNORMAL HIGH (ref 22–32)
CREATININE: 1.94 mg/dL — AB (ref 0.44–1.00)
GFR, EST AFRICAN AMERICAN: 27 mL/min — AB (ref >60)
GFR, EST NON AFRICAN AMERICAN: 23 mL/min — AB (ref >60)
Glucose, Bld: 267 mg/dL — ABNORMAL HIGH (ref 65–99)
Potassium: 3.9 mmol/L (ref 3.5–5.1)
Sodium: 133 mmol/L — ABNORMAL LOW (ref 135–145)
TOTAL PROTEIN: 7.1 g/dL (ref 6.5–8.1)

## 2016-02-15 LAB — VITAMIN B12: VITAMIN B 12: 1726 pg/mL — AB (ref 180–914)

## 2016-02-15 LAB — T4, FREE: Free T4: 1.11 ng/dL (ref 0.61–1.12)

## 2016-02-15 LAB — MAGNESIUM: Magnesium: 2.3 mg/dL (ref 1.7–2.4)

## 2016-02-15 LAB — AMMONIA: Ammonia: 23 umol/L (ref 9–35)

## 2016-02-15 LAB — TSH: TSH: 5.384 u[IU]/mL — AB (ref 0.350–4.500)

## 2016-02-15 MED ORDER — INSULIN ASPART 100 UNIT/ML ~~LOC~~ SOLN
15.0000 [IU] | Freq: Once | SUBCUTANEOUS | Status: AC
  Administered 2016-02-15: 15 [IU] via SUBCUTANEOUS

## 2016-02-15 MED ORDER — SODIUM CHLORIDE 0.9 % IV SOLN
250.0000 mL | INTRAVENOUS | Status: DC | PRN
  Administered 2016-02-15: 75 mL/h via INTRAVENOUS

## 2016-02-15 MED ORDER — LORAZEPAM 2 MG/ML IJ SOLN
0.5000 mg | Freq: Once | INTRAMUSCULAR | Status: AC
  Administered 2016-02-15: 0.5 mg via INTRAVENOUS
  Filled 2016-02-15: qty 1

## 2016-02-15 MED ORDER — AMIODARONE HCL 200 MG PO TABS
200.0000 mg | ORAL_TABLET | Freq: Every day | ORAL | Status: DC
  Administered 2016-02-16: 200 mg via ORAL
  Filled 2016-02-15: qty 1

## 2016-02-15 NOTE — Progress Notes (Signed)
Pt will open eyes and follow commands, but does not stay wake long enough to take meds etc. Pt has not voided this shift, IV NS started and bladder scan performed. Pt has 224cc urine in bladder. Ann [name] was paged to get IV hydration orders , pt is not drinking or eating much at all. Will continue to monitor. [facility] Jaslene Marsteller [name]

## 2016-02-15 NOTE — Evaluation (Addendum)
Physical Therapy Evaluation Patient Details Name: Ann [name] MRN: [id number] DOB: April 23, 1933 Today's Date: 02/15/2016   History of Present Illness  82 [facility].o. [facility] for seizure like episode, found to be in A-fib with RVR rates in the 160-180s.  Underwent DCCVx2 and converted to NSR.  She was admitted to ICU hwere she had actue pulmonary edema, started on IV lasix and BiPap.  On 5/15 she had some runs of VT/VF and had a code blue.  [facility] for greater cardiac expertise.  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    Clinical Impression  Pt was able to sit up EOB with me with max assist.  She was unable to initiate movement, doesn't seem to recognize her husband's voice, and is swatting at things that are not there in the air.  Pt is significantly different than her baseline.  If she continues like this, she will need SNF placement at discharge.   PT to follow acutely for deficits listed below.       Follow Up Recommendations SNF;Supervision/Assistance - 24 hour    Equipment Recommendations  Rolling walker with 5" wheels;Wheelchair (measurements PT);Wheelchair cushion (measurements PT);3in1 (PT);Hospital bed    Recommendations for Other Services   NA     Precautions / Restrictions Precautions Precautions: Fall      Mobility  Bed Mobility Overal bed mobility: Needs Assistance Bed Mobility: Supine to Sit;Sit to Supine     Supine to sit: Mod assist;HOB elevated Sit to supine: Max assist   General bed mobility comments: Pt unable to initiate move to EOB, but once therapist initiated move to EOB pt did help whith trunk a little bit.  Total assist to go back to supine.   Transfers                 General transfer comment: Pt's husband too nervous to let therapist attempt.  Reports she is exhausted and did not sleep at all last night.  He reports she has been  asleep the last 6 hours.              Balance Overall balance assessment: Needs assistance Sitting-balance support: Feet supported;Bilateral upper extremity supported Sitting balance-Leahy Scale: Poor Sitting balance - Comments: min to mod assist to sit EOB due to posterior lean.                                       Pertinent Vitals/Pain Pain Assessment: Faces Faces Pain Scale: No hurt    Home Living Family/patient expects to be discharged to:: Private residence Living Arrangements: Spouse/significant other Available Help at Discharge: Family;Available 24 hours/day Type of Home: House Home Access: Stairs to enter Entrance Stairs-Rails: None Entrance Stairs-Number of Steps: 1 Home Layout: One level Home Equipment: Cane - single point      Prior Function Level of Independence: Needs assistance   Gait / Transfers Assistance Needed: curbs, steps, community gait walked holding husband's arm  ADL's / Homemaking Assistance Needed: usually no assist bathing or dressing           Extremity/Trunk Assessment   Upper Extremity Assessment: Generalized weakness           Lower Extremity Assessment: Generalized weakness      Cervical / Trunk Assessment: Normal  Communication  Communication: HOH  Cognition Arousal/Alertness: Lethargic (per RN given some sedative due to restlestness earlier) Behavior During Therapy: Restless Overall Cognitive Status: Impaired/Different from baseline Area of Impairment: Orientation;Attention;Memory;Following commands;Safety/judgement;Awareness;Problem solving Orientation Level: Disoriented to;Person;Place;Time;Situation Current Attention Level: Focused Memory: Decreased short-term memory Following Commands: Follows one step commands inconsistently Safety/Judgement: Decreased awareness of safety;Decreased awareness of deficits Awareness: Intellectual Problem Solving: Slow processing;Decreased initiation;Difficulty  sequencing;Requires tactile cues;Requires verbal cues General Comments: Pt seated EOB constantly reaching for objects in the air, swatting them away.  Holding husband's hand, but showing no recognition of his voice.  When she speaks, it is language of confusion, non sensical sentances.              Assessment/Plan    PT Assessment Patient needs continued PT services  PT Diagnosis Difficulty walking;Abnormality of gait;Generalized weakness;Acute pain;Altered mental status   PT Problem List Decreased strength;Decreased activity tolerance;Decreased balance;Decreased mobility;Decreased cognition;Decreased knowledge of use of DME;Decreased safety awareness;Decreased knowledge of precautions  PT Treatment Interventions DME instruction;Gait training;Stair training;Functional mobility training;Therapeutic activities;Therapeutic exercise;Balance training;Neuromuscular re-education;Cognitive remediation;Patient/family education   PT Goals (Current goals can be found in the Care Plan section) Acute Rehab PT Goals Patient Stated Goal: husband wants her to return to her baseline.  PT Goal Formulation: With family Time For Goal Achievement: 02/29/16 Potential to Achieve Goals: Good    Frequency Min 3X/week   Barriers to discharge Decreased caregiver support in her current condition, husband unable to care for her.        End of Session   Activity Tolerance: Other (comment) (limited by husband and cognition) Patient left: in bed;with call bell/phone within reach;with bed alarm set Nurse Communication: Mobility status         Time: 1610-9604 PT Time Calculation (min) (ACUTE ONLY): 14 min   Charges:   PT Evaluation $PT Eval Moderate Complexity: 1 Procedure          Izaan Kingbird B. Yarieliz Wasser, PT, DPT [phone]   02/15/2016, 4:07 PM

## 2016-02-15 NOTE — Progress Notes (Signed)
[facility] PROGRESS NOTE  Ann [name] ZOX:[id number] DOB: 07/13/1933 DOA: 02/13/2016  PCP: Marlow Baars, MD  Brief HPI: 82 year old Caucasian female with a past medical history of insulin-dependent diabetes, atrial fibrillation on anticoagulation, hypothyroidism, presented to the emergency department at [facility] on Saturday, May 13 with a seizure-like episode. She was found to have atrial fibrillation with RVR at a rate of 160. She was admitted to the hospital. She underwent cardioversion and converted to sinus rhythm. She was noted to have pulmonary edema and was started on Lasix. She was seen by cardiology over at [facility] She was also given amiodarone due to recurrence of A. fib and she converted to sinus rhythm. And then she had an episode of cardiac arrest with the VT/VF. ACLS protocol was followed and the patient had return of spontaneous circulation. Discussions were held with electrophysiologist here at [facility]. The patient was transferred for further workup.  Past medical history:  Past Medical History  Diagnosis Date  . Dyslipidemia   . Osteoporosis, unspecified   . Unspecified hypothyroidism   . Unspecified essential hypertension   . Type I (juvenile type) diabetes mellitus without mention of complication, not stated as uncontrolled   . Atrial fibrillation (HCC)   . Allergic rhinitis, cause unspecified     Consultants: Cardiology  Procedures: None  Antibiotics: None  Subjective: Patient noted to be hallucinating this morning. She thinks that there is a snake on her bed. This somewhat agitated. Denies any pain.   Objective:  Vital Signs  Filed Vitals:   02/14/16 1756 02/14/16 1954 02/14/16 2000 02/15/16 0500  BP: 98/69 95/81 95/81  172/66  Pulse:  66  63  Temp:  98.3 F (36.8 C)  98.7 F (37.1 C)  TempSrc:  Oral    Resp:  16  18  Height:      Weight:    70.943 kg (156 lb 6.4 oz)  SpO2:  93%  96%    Intake/Output Summary  (Last 24 hours) at 02/15/16 0834 Last data filed at 02/15/16 0600  Gross per 24 hour  Intake    840 ml  Output   1300 ml  Net   -460 ml   Filed Weights   02/13/16 1850 02/14/16 0445 02/15/16 0500  Weight: 72.4 kg (159 lb 9.8 oz) 72.2 kg (159 lb 2.8 oz) 70.943 kg (156 lb 6.4 oz)    General appearance: Alert. Agitated and somewhat delirious.  Resp: Diminished air entry at the bases but mostly clear to auscultation. No wheezing, rales or rhonchi. Cardio: regular rate and rhythm, S1, S2 normal, no murmur, click, rub or gallop GI: soft, non-tender; bowel sounds normal; no masses,  no organomegaly Extremities: extremities normal, atraumatic, no cyanosis or edema Neurologic: Awake and alert. Delirious. No facial asymmetry. Hallucinating. Moving all her extremities.   Lab Results:  Data Reviewed: I have personally reviewed following labs and imaging studies  CBC:  Recent Labs Lab 02/13/16 2052 02/15/16 0559  WBC 9.4 10.8*  NEUTROABS 7.2  --   HGB 16.8* 16.6*  HCT 49.8* 49.6*  MCV 93.8 93.9  PLT 248 277   Basic Metabolic Panel:  Recent Labs Lab 02/13/16 2052 02/14/16 0643 02/15/16 0559  NA 133* 136 133*  K 5.3* 4.4 3.9  CL 86* 92* 87*  CO2 33* 32 33*  GLUCOSE 303* 224* 267*  BUN 51* 52* 53*  CREATININE 2.31* 2.00* 1.94*  CALCIUM 9.3 9.4 9.6  MG  --   --  2.3  PHOS 3.2  --   --    GFR: Estimated Creatinine Clearance: 21.7 mL/min (by C-G formula based on Cr of 1.94).  Liver Function Tests:  Recent Labs Lab 02/13/16 2052 02/15/16 0559  AST 34 38  ALT 26 25  ALKPHOS 89 85  BILITOT 1.6* 1.2  PROT 7.2 7.1  ALBUMIN 3.0* 3.0*   CBG:  Recent Labs Lab 02/13/16 2051 02/14/16 0731 02/14/16 1109 02/14/16 1641 02/14/16 2112  GLUCAP 339* 205* 310* 439* 246*   Urine analysis:    Component Value Date/Time   COLORURINE YELLOW 02/13/2016 2234   APPEARANCEUR CLEAR 02/13/2016 2234   LABSPEC 1.014 02/13/2016 2234   PHURINE 5.5 02/13/2016 2234   GLUCOSEU  NEGATIVE 02/13/2016 2234   HGBUR MODERATE* 02/13/2016 2234   BILIRUBINUR NEGATIVE 02/13/2016 2234   KETONESUR NEGATIVE 02/13/2016 2234   PROTEINUR NEGATIVE 02/13/2016 2234   NITRITE NEGATIVE 02/13/2016 2234   LEUKOCYTESUR NEGATIVE 02/13/2016 2234    Recent Results (from the past 240 hour(s))  MRSA PCR Screening     Status: Abnormal   Collection Time: 02/13/16  6:53 PM  Result Value Ref Range Status   MRSA by PCR POSITIVE (A) NEGATIVE Final    Comment:        The GeneXpert MRSA Assay (FDA approved for NASAL specimens only), is one component of a comprehensive MRSA colonization surveillance program. It is not intended to diagnose MRSA infection nor to guide or monitor treatment for MRSA infections. RESULT CALLED TO, READ BACK BY AND VERIFIED WITH: H MOOSE RN 2240 02/13/16 A BROWNING       Radiology Studies: Ct Head Wo Contrast  02/14/2016  CLINICAL DATA:  Altered mental status EXAM: CT HEAD WITHOUT CONTRAST TECHNIQUE: Contiguous axial images were obtained from the base of the skull through the vertex without intravenous contrast. COMPARISON:  None. FINDINGS: No skull fracture is noted. Paranasal sinuses and mastoid air cells are unremarkable. No intracranial hemorrhage, mass effect or midline shift. No acute cortical infarction. No mass lesion is noted on this unenhanced scan. Mild cerebral atrophy. Mild periventricular and patchy subcortical white matter decreased attenuation probable due to chronic small vessel ischemic changes. Mild atherosclerotic calcifications of carotid siphon. No intra or extra-axial fluid collection. IMPRESSION: No acute intracranial abnormality. Mild cerebral atrophy. Periventricular and patchy subcortical white matter decreased attenuation probable due to chronic small vessel ischemic changes. No definite acute cortical infarction. Electronically Signed   By: Natasha Mead M.D.   On: 02/14/2016 13:23   Portable Chest 1 View  02/13/2016  CLINICAL DATA:  Atrial  fibrillation with rapid ventricular response EXAM: PORTABLE CHEST 1 VIEW COMPARISON:  Portable exam 2032 hours without priors for comparison. FINDINGS: Borderline enlargement of cardiac silhouette. Probable mitral annular calcification. Atherosclerotic calcification aorta. Pulmonary vascularity normal. Bronchitic changes without pulmonary infiltrate, pleural effusion or pneumothorax. Bones unremarkable. IMPRESSION: Borderline enlargement of cardiac silhouette. Bronchitic changes without infiltrate. Electronically Signed   By: Ulyses Southward M.D.   On: 02/13/2016 20:46     Medications:  Scheduled: . amiodarone  200 mg Oral Daily  . apixaban  2.5 mg Oral BID  . atorvastatin  40 mg Oral Daily  . Chlorhexidine Gluconate Cloth  6 each Topical Q0600  . docusate sodium  100 mg Oral BID  . feeding supplement (GLUCERNA SHAKE)  237 mL Oral BID BM  . furosemide  40 mg Oral Daily  . insulin aspart  0-15 Units Subcutaneous TID WC  . insulin aspart  0-5 Units Subcutaneous QHS  .  insulin detemir  18 Units Subcutaneous QHS  . levothyroxine  150 mcg Oral QAC breakfast  . LORazepam  0.5 mg Intravenous Once  . metoprolol tartrate  25 mg Oral TID  . multivitamin with minerals  1 tablet Oral Daily  . mupirocin ointment  1 application Nasal BID  . polyethylene glycol  17 g Oral Daily  . potassium chloride  40 mEq Oral Daily  . sodium chloride flush  3 mL Intravenous Q12H  . sodium chloride flush  3 mL Intravenous Q12H   Continuous:  RUE:AVWUJW chloride, acetaminophen **OR** acetaminophen, albuterol, bisacodyl, magnesium hydroxide, ondansetron (ZOFRAN) IV, sodium chloride flush  Assessment/Plan:  Active Problems:   Hypothyroidism   Essential hypertension   Chronic atrial fibrillation (HCC)   Asthma   Cardiac arrest (HCC)   Atrial fibrillation with rapid ventricular response (HCC)   Insulin dependent diabetes mellitus (HCC)    Recent Cardiac arrest with VT/VF/elevated troponin This occurred while she  was at [facility]. Patient was transferred here for further cardiology input. This episode occurred in the setting of A. fib with RVR and pulmonary edema. Currently, patient is in sinus rhythm. Her troponins were elevated at [facility]. Patient denies any chest pain currently. Seen by cardiology. No further intervention is planned at this time.  Atrial fibrillation, chronic. She has a known history of atrial fibrillation and has been on oral anticoagulation. She was admitted with RVR to [facility] a few days ago. She required cardioversion and then had to be placed on amiodarone with which she converted to sinus rhythm. Cardiology recommended continuing amiodarone despite prolonged QT interval. She is on oral anticoagulation as well. Echocardiogram was done at [facility] and showed EF of 55-60%. Hypokinetic apical anterior and apical anterior wall was seen. Mild to moderate aortic regurgitation. Mild aortic stenosis was noted. Moderate to severe tricuspid regurgitation was noted. RV systolic pressure was 74 mmHg. Currently is in sinus rhythm. Management per cardiology. She is also on metoprolol.   Acute encephalopathy/acute delirium She does not have any focal neurological deficits. UA did not show any infection. CT head did not show any acute findings. Patient could have underlying cognitive impairment. Dementia workup is in progress. Ammonia level is normal. Discussed the side effects of amiodarone with cardiology and they did not think that amiodarone is responsible for her mental status changes. We will discontinue all nonessential medications including Singulair. We cannot give her any Haldol or other psychotropic medications due to prolonged QT interval. Can try low-dose Ativan. Symptoms could be due to acute hospitalization and acute illness. If she does not improve, may need to get neurology and psychiatry input. Could consider MRI brain as well. However, she will need to be  able to lie still for that.  Acute pulmonary edema Secondary to A. fib with RVR. She was given diuretics with improvement. Continue oral Lasix.  Insulin-dependent diabetes mellitus. Continue long-acting insulin and SSI. Monitor CBGs. CBGs remains poorly controlled. Check HbA1c. Levemir dose was increased last night. Continue to monitor trends today. She may need further increase in dose.   Chronic kidney disease, unknown stage. Apparently her creatinine at the time of her admission to [facility] was about 1.5. Creatinine is slightly improved this morning. Continue to monitor urine output. Monitor renal function closely. UA was done at the time of admission and did not suggest any infection. Moderate hemoglobin with 6-30 RBCs. No protein. She was hyperkalemic last night. She is noted to be on ACE inhibitor, which has been  held for now. Await stabilization of renal function before resuming ACE inhibitor.  History of essential hypertension Monitor blood pressures closely. Continue current medications.   History of hypothyroidism TSH, free T4 is pending.  DVT Prophylaxis: On Apixaban    Code Status: Full code  Family Communication: Discussed with the patient's husband at bedside.  Disposition Plan: Await improvement in mental status. PT and OT evaluation.    LOS: 2 days   [facility] Name HospitalKRISHNAN,Donya Tomaro  [facility] Pager [phone] 02/15/2016, 8:34 AM  If 7PM-7AM, please contact night-coverage at [email or web address], password [email or web address]

## 2016-02-15 NOTE — Care Management Important Message (Signed)
Important Message  Patient Details  Name: Ann [name] MRN: [id number] Date of Birth: [birth year]   Medicare Important Message Given:  Yes    [name] Abena 02/15/2016, 11:10 AM

## 2016-02-15 NOTE — Progress Notes (Signed)
Subjective:  No complaints of shortness of breath or chest pain.  Very hard of hearing, mild confusion.  Objective:  Vital Signs in the last 24 hours: BP 172/66 mmHg  Pulse 63  Temp(Src) 98.7 F (37.1 C) (Oral)  Resp 18  Ht 5\' 6"  (1.676 m)  Wt 70.943 kg (156 lb 6.4 oz)  BMI 25.26 kg/m2  SpO2 96%  Physical Exam: Elderly female hard of hearing but in no acute distress Lungs:  Clear Cardiac:  Regular rhythm, normal S1 and S2, no S3 Extremities:  No edema present  Intake/Output from previous day: 05/19 0701 - 05/20 0700 In: 840 [P.O.:840] Out: 1300 [Urine:1300]  Weight Filed Weights   02/13/16 1850 02/14/16 0445 02/15/16 0500  Weight: 72.4 kg (159 lb 9.8 oz) 72.2 kg (159 lb 2.8 oz) 70.943 kg (156 lb 6.4 oz)    Lab Results: Basic Metabolic Panel:  Recent Labs  16/06/9604/19/17 0643 02/15/16 0559  NA 136 133*  K 4.4 3.9  CL 92* 87*  CO2 32 33*  GLUCOSE 224* 267*  BUN 52* 53*  CREATININE 2.00* 1.94*   CBC:  Recent Labs  02/13/16 2052 02/15/16 0559  WBC 9.4 10.8*  NEUTROABS 7.2  --   HGB 16.8* 16.6*  HCT 49.8* 49.6*  MCV 93.8 93.9  PLT 248 277    Telemetry: Sinus rhythm  Assessment/Plan:  1. Atrial fibrillation currently maintaining sinus rhythm having been started back on amiodarone 2.  Non-STEMI versus demand ischemia due to A. fib with RVR 3.  Acute renal failure on chronic improving 4.  Diabetes.  Primary team  Recommendations:  Tolerating amiodarone well with sinus rhythm.  If she remains stable could probably discharge home tomorrow with follow-up [place] in [place] next week.  Darden PalmerW. Spencer Tilley, Jr.  MD [facility]iology  02/15/2016, 9:06 AM

## 2016-02-16 LAB — BASIC METABOLIC PANEL
ANION GAP: 13 (ref 5–15)
BUN: 54 mg/dL — AB (ref 6–20)
CALCIUM: 9.4 mg/dL (ref 8.9–10.3)
CO2: 29 mmol/L (ref 22–32)
Chloride: 93 mmol/L — ABNORMAL LOW (ref 101–111)
Creatinine, Ser: 2.06 mg/dL — ABNORMAL HIGH (ref 0.44–1.00)
GFR calc Af Amer: 25 mL/min — ABNORMAL LOW (ref >60)
GFR calc non Af Amer: 22 mL/min — ABNORMAL LOW (ref >60)
GLUCOSE: 164 mg/dL — AB (ref 65–99)
Potassium: 4.6 mmol/L (ref 3.5–5.1)
Sodium: 135 mmol/L (ref 135–145)

## 2016-02-16 LAB — CBC
HEMATOCRIT: 49 % — AB (ref 36.0–46.0)
HEMOGLOBIN: 16.2 g/dL — AB (ref 12.0–15.0)
MCH: 31.3 pg (ref 26.0–34.0)
MCHC: 33.1 g/dL (ref 30.0–36.0)
MCV: 94.6 fL (ref 78.0–100.0)
Platelets: 290 10*3/uL (ref 150–400)
RBC: 5.18 MIL/uL — ABNORMAL HIGH (ref 3.87–5.11)
RDW: 14 % (ref 11.5–15.5)
WBC: 13.2 10*3/uL — ABNORMAL HIGH (ref 4.0–10.5)

## 2016-02-16 LAB — GLUCOSE, CAPILLARY
GLUCOSE-CAPILLARY: 174 mg/dL — AB (ref 65–99)
GLUCOSE-CAPILLARY: 396 mg/dL — AB (ref 65–99)
GLUCOSE-CAPILLARY: 281 mg/dL — AB (ref 65–99)
Glucose-Capillary: 294 mg/dL — ABNORMAL HIGH (ref 65–99)

## 2016-02-16 LAB — RPR: RPR: NONREACTIVE

## 2016-02-16 LAB — HIV ANTIBODY (ROUTINE TESTING W REFLEX): HIV SCREEN 4TH GENERATION: NONREACTIVE

## 2016-02-16 MED ORDER — AMIODARONE HCL 200 MG PO TABS
200.0000 mg | ORAL_TABLET | Freq: Three times a day (TID) | ORAL | Status: DC
  Administered 2016-02-16 – 2016-02-17 (×5): 200 mg via ORAL
  Filled 2016-02-16 (×5): qty 1

## 2016-02-16 MED ORDER — SODIUM CHLORIDE 0.9 % IV SOLN: 250.0000 mL | INTRAVENOUS | Status: DC

## 2016-02-16 NOTE — Progress Notes (Addendum)
Subjective:  She was lethargic last night and required some IV fluids overnight.  This morning she is awake and alert and knows that she is in the hospital.  She has remained in sinus rhythm on amiodarone until yesterday and then went back out of rhythm. May have missed some doses of metoprolol.  Creatinine is up somewhat this morning.  No complaints of shortness of breath or chest pain.  Objective:  Vital Signs in the last 24 hours: BP 106/68 mmHg  Pulse 52  Temp(Src) 97.7 F (36.5 C) (Axillary)  Resp 20  Ht 5\' 6"  (1.676 m)  Wt 72.666 kg (160 lb 3.2 oz)  BMI 25.87 kg/m2  SpO2 96%  Physical Exam: Elderly female hard of hearing but in no acute distress Lungs:  Clear Cardiac:  Regular rhythm, normal S1 and S2, no S3 Extremities:  No edema present  Intake/Output from previous day: 05/20 0701 - 05/21 0700 In: 862.5 [P.O.:360; I.V.:502.5] Out: 400 [Urine:400]  Weight Filed Weights   02/14/16 0445 02/15/16 0500 02/16/16 0500  Weight: 72.2 kg (159 lb 2.8 oz) 70.943 kg (156 lb 6.4 oz) 72.666 kg (160 lb 3.2 oz)    Lab Results: Basic Metabolic Panel:  Recent Labs  96/12/5403/20/17 0559 02/16/16 0436  NA 133* 135  K 3.9 4.6  CL 87* 93*  CO2 33* 29  GLUCOSE 267* 164*  BUN 53* 54*  CREATININE 1.94* 2.06*   CBC:  Recent Labs  02/13/16 2052 02/15/16 0559 02/16/16 0436  WBC 9.4 10.8* 13.2*  NEUTROABS 7.2  --   --   HGB 16.8* 16.6* 16.2*  HCT 49.8* 49.6* 49.0*  MCV 93.8 93.9 94.6  PLT 248 277 290    Telemetry: Atrial fibrillation with more rapid response  Assessment/Plan:  1. Atrial fibrillation currently back out of rhythm 2.  Non-STEMI versus demand ischemia due to A. fib with RVR 3.  Acute renal failure on chronic Slight increase in creatinine today.   4.  Diabetes.  Primary team  Recommendations:  She is out of rhythm again and will need amiodarone increased.Marland Kitchen.  Has moderate elevation of WBC and has some other issues with function as well as some confusion that may  be delaying discharge.  Darden PalmerW. Spencer Tilley, Jr.  MD [facility] Cardiology  02/16/2016, 8:51 AM

## 2016-02-16 NOTE — Evaluation (Signed)
Occupational Therapy Evaluation Patient Details Name: Ann QuarryMolly H [name] MRN: [id number] DOB: 1932/12/05 Today's Date: 02/16/2016    History of Present Illness 82 y.o. female [facility] for seizure like episode, found to be in A-fib with RVR rates in the 160-180s.  Underwent DCCVx2 and converted to NSR.  She was admitted to ICU hwere she had actue pulmonary edema, started on IV lasix and BiPap.  On 5/15 she had some runs of VT/VF and had a code blue.  [facility].  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     Clinical Impression   Pt. Was is low vision and hard of hearing and requires max cues to understand directions. Pt. Is currently requiring A with ADLs and mobility. Pt. Is motivated to increase ability to care for self and d/c home with husband.     Follow Up Recommendations  Home health OT    Equipment Recommendations  None recommended by OT    Recommendations for Other Services       Precautions / Restrictions Precautions Precautions: Fall;Other (comment) Precaution Comments: low vision Restrictions Weight Bearing Restrictions: No      Mobility Bed Mobility Overal bed mobility: Needs Assistance Bed Mobility: Supine to Sit     Supine to sit: Min assist Sit to supine: Min assist      Transfers Overall transfer level: Needs assistance Equipment used: 1 person hand held assist Transfers: Stand Pivot Transfers   Stand pivot transfers: Mod assist            Balance                                            ADL Overall ADL's : Needs assistance/impaired Eating/Feeding: Independent   Grooming: Wash/dry hands;Wash/dry face;Supervision/safety;Set up;Sitting   Upper Body Bathing: Supervision/ safety;Set up;Sitting   Lower Body Bathing: Minimal assistance;Sit to/from stand   Upper Body Dressing :  Supervision/safety;Set up;Sitting   Lower Body Dressing: Minimal assistance;Sit to/from stand   Toilet Transfer: Moderate assistance;BSC   Toileting- Clothing Manipulation and Hygiene: Moderate assistance;Sit to/from stand       Functional mobility during ADLs: Moderate assistance General ADL Comments: Pt. was Mod A [facility] and was dependent with back perineal hygiene.      Vision     Perception     Praxis      Pertinent Vitals/Pain Pain Assessment: No/denies pain     Hand Dominance Right   Extremity/Trunk Assessment Upper Extremity Assessment Upper Extremity Assessment: Generalized weakness           Communication Communication Communication: HOH   Cognition Arousal/Alertness: Lethargic Behavior During Therapy: WFL for tasks assessed/performed Overall Cognitive Status: Within Functional Limits for tasks assessed                     General Comments       Exercises       Shoulder Instructions      Home Living Family/patient expects to be discharged to:: Private residence Living Arrangements: Spouse/significant other Available Help at Discharge: Family;Available 24 hours/day Type of Home: House Home Access: Stairs to enter Enter[employer] CorporationEntrance Stairs-Number of Steps: 1 Entrance Stairs-Rails: None Home Layout: One level     Bathroom Shower/Tub: Walk-in shower  Bathroom Toilet: Handicapped height     Home Equipment: Cane - single point;Bedside commode;Shower seat          Prior Functioning/Environment Level of Independence: Needs assistance  Gait / Transfers Assistance Needed: curbs, steps, community gait walked holding husband's arm ADL's / Homemaking Assistance Needed: usually no assist bathing or dressing Communication / Swallowing Assistance Needed: HOH, 2 hearing aids      OT Diagnosis: Generalized weakness   OT Problem List: Decreased strength;Decreased activity tolerance;Decreased knowledge of use of DME or AE   OT  Treatment/Interventions: Self-care/ADL training;Therapeutic activities;Patient/family education    OT Goals(Current goals can be found in the care plan section) Acute Rehab OT Goals Patient Stated Goal: Pt. wants to get stronger and d/c home. OT Goal Formulation: With patient Time For Goal Achievement: 03/01/16 Potential to Achieve Goals: Good ADL Goals Pt Will Perform Grooming: with modified independence;standing Pt Will Perform Upper Body Bathing: with modified independence;sitting Pt Will Perform Lower Body Bathing: with modified independence;sit to/from stand Pt Will Perform Upper Body Dressing: with modified independence;sitting Pt Will Perform Lower Body Dressing: with modified independence;sit to/from stand Pt Will Transfer to Toilet: with modified independence;bedside commode;ambulating Pt Will Perform Toileting - Clothing Manipulation and hygiene: with modified independence;sit to/from stand  OT Frequency: Min 2X/week   Barriers to D/C:            Co-evaluation              End of Session    Activity Tolerance: Patient tolerated treatment well Patient left: in bed;with call bell/phone within reach   Time: 0855-0950 OT Time Calculation (min): 55 min Charges:  OT General Charges $OT Visit: 1 Procedure OT Evaluation $OT Eval Moderate Complexity: 1 Procedure OT Treatments $Self Care/Home Management : 23-37 mins G-Codes:    Carley Strickling 03/17/16, 9:55 AM

## 2016-02-16 NOTE — Progress Notes (Signed)
[facility] PROGRESS NOTE  Ann [name] WUJ:[id number] DOB: 11/18/1932 DOA: 02/13/2016  PCP: Marlow Baars, MD  Brief HPI: 82 year old Caucasian female with a past medical history of insulin-dependent diabetes, atrial fibrillation on anticoagulation, hypothyroidism, presented to the emergency department at [facility] on Saturday, May 13 with a seizure-like episode. She was found to have atrial fibrillation with RVR at a rate of 160. She was admitted to the hospital. She underwent cardioversion and converted to sinus rhythm. She was noted to have pulmonary edema and was started on Lasix. She was seen by cardiology over at [facility]. She was also given amiodarone due to recurrence of A. fib and she converted to sinus rhythm. And then she had an episode of cardiac arrest with the VT/VF. ACLS protocol was followed and the patient had return of spontaneous circulation. Discussions were held with electrophysiologist here at [facility]. The patient was transferred for further workup.  Past medical history:  Past Medical History  Diagnosis Date  . Dyslipidemia   . Osteoporosis, unspecified   . Unspecified hypothyroidism   . Unspecified essential hypertension   . Type I (juvenile type) diabetes mellitus without mention of complication, not stated as uncontrolled   . Atrial fibrillation (HCC)   . Allergic rhinitis, cause unspecified     Consultants: Cardiology  Procedures: None  Antibiotics: None  Subjective: Patient noted to be sitting up on her bed and eating breakfast. She denies any further hallucinations. Still feeling sleepy this morning. Denies any chest pain, shortness of breath.   Objective:  Vital Signs  Filed Vitals:   02/15/16 1326 02/15/16 2300 02/16/16 0500 02/16/16 0901  BP: 140/65 111/58 106/68 113/63  Pulse: 66  52 79  Temp: 97 F (36.1 C)  97.7 F (36.5 C)   TempSrc: Axillary  Axillary   Resp:   20   Height:      Weight:   72.666 kg (160  lb 3.2 oz)   SpO2: 94% 98% 96%     Intake/Output Summary (Last 24 hours) at 02/16/16 0902 Last data filed at 02/16/16 0701  Gross per 24 hour  Intake  742.5 ml  Output      0 ml  Net  742.5 ml   Filed Weights   02/14/16 0445 02/15/16 0500 02/16/16 0500  Weight: 72.2 kg (159 lb 2.8 oz) 70.943 kg (156 lb 6.4 oz) 72.666 kg (160 lb 3.2 oz)    General appearance: Alert. Calm this morning.  Resp: Diminished air entry at the bases but mostly clear to auscultation. No wheezing, rales or rhonchi. Cardio: S1, S2 is irregularly irregular. Tachycardic this morning. No S3, S4. No rubs, murmurs, or bruit. No pedal edema.  GI: soft, non-tender; bowel sounds normal; no masses,  no organomegaly Neurologic: Awake and alert this morning. No longer agitated. No facial asymmetry. Moving all extremities.    Lab Results:  Data Reviewed: I have personally reviewed following labs and imaging studies  CBC:  Recent Labs Lab 02/13/16 2052 02/15/16 0559 02/16/16 0436  WBC 9.4 10.8* 13.2*  NEUTROABS 7.2  --   --   HGB 16.8* 16.6* 16.2*  HCT 49.8* 49.6* 49.0*  MCV 93.8 93.9 94.6  PLT 248 277 290   Basic Metabolic Panel:  Recent Labs Lab 02/13/16 2052 02/14/16 0643 02/15/16 0559 02/16/16 0436  NA 133* 136 133* 135  K 5.3* 4.4 3.9 4.6  CL 86* 92* 87* 93*  CO2 33* 32 33* 29  GLUCOSE 303* 224* 267* 164*  BUN 51* 52* 53* 54*  CREATININE 2.31* 2.00* 1.94* 2.06*  CALCIUM 9.3 9.4 9.6 9.4  MG  --   --  2.3  --   PHOS 3.2  --   --   --    GFR: Estimated Creatinine Clearance: 22.2 mL/min (by C-G formula based on Cr of 2.06).  Liver Function Tests:  Recent Labs Lab 02/13/16 2052 02/15/16 0559  AST 34 38  ALT 26 25  ALKPHOS 89 85  BILITOT 1.6* 1.2  PROT 7.2 7.1  ALBUMIN 3.0* 3.0*   CBG:  Recent Labs Lab 02/15/16 0915 02/15/16 1151 02/15/16 1730 02/15/16 2334 02/16/16 0811  GLUCAP 350* 419* 99 154* 174*   Urine analysis:    Component Value Date/Time   COLORURINE YELLOW  02/13/2016 2234   APPEARANCEUR CLEAR 02/13/2016 2234   LABSPEC 1.014 02/13/2016 2234   PHURINE 5.5 02/13/2016 2234   GLUCOSEU NEGATIVE 02/13/2016 2234   HGBUR MODERATE* 02/13/2016 2234   BILIRUBINUR NEGATIVE 02/13/2016 2234   KETONESUR NEGATIVE 02/13/2016 2234   PROTEINUR NEGATIVE 02/13/2016 2234   NITRITE NEGATIVE 02/13/2016 2234   LEUKOCYTESUR NEGATIVE 02/13/2016 2234    Recent Results (from the past 240 hour(s))  MRSA PCR Screening     Status: Abnormal   Collection Time: 02/13/16  6:53 PM  Result Value Ref Range Status   MRSA by PCR POSITIVE (A) NEGATIVE Final    Comment:        The GeneXpert MRSA Assay (FDA approved for NASAL specimens only), is one component of a comprehensive MRSA colonization surveillance program. It is not intended to diagnose MRSA infection nor to guide or monitor treatment for MRSA infections. RESULT CALLED TO, READ BACK BY AND VERIFIED WITH: H MOOSE RN 2240 02/13/16 A BROWNING       Radiology Studies: Ct Head Wo Contrast  02/14/2016  CLINICAL DATA:  Altered mental status EXAM: CT HEAD WITHOUT CONTRAST TECHNIQUE: Contiguous axial images were obtained from the base of the skull through the vertex without intravenous contrast. COMPARISON:  None. FINDINGS: No skull fracture is noted. Paranasal sinuses and mastoid air cells are unremarkable. No intracranial hemorrhage, mass effect or midline shift. No acute cortical infarction. No mass lesion is noted on this unenhanced scan. Mild cerebral atrophy. Mild periventricular and patchy subcortical white matter decreased attenuation probable due to chronic small vessel ischemic changes. Mild atherosclerotic calcifications of carotid siphon. No intra or extra-axial fluid collection. IMPRESSION: No acute intracranial abnormality. Mild cerebral atrophy. Periventricular and patchy subcortical white matter decreased attenuation probable due to chronic small vessel ischemic changes. No definite acute cortical infarction.  Electronically Signed   By: Natasha Mead M.D.   On: 02/14/2016 13:23     Medications:  Scheduled: . amiodarone  200 mg Oral Daily  . apixaban  2.5 mg Oral BID  . atorvastatin  40 mg Oral Daily  . Chlorhexidine Gluconate Cloth  6 each Topical Q0600  . docusate sodium  100 mg Oral BID  . feeding supplement (GLUCERNA SHAKE)  237 mL Oral BID BM  . furosemide  40 mg Oral Daily  . insulin aspart  0-15 Units Subcutaneous TID WC  . insulin aspart  0-5 Units Subcutaneous QHS  . insulin detemir  18 Units Subcutaneous QHS  . levothyroxine  150 mcg Oral QAC breakfast  . metoprolol tartrate  25 mg Oral TID  . multivitamin with minerals  1 tablet Oral Daily  . mupirocin ointment  1 application Nasal BID  . polyethylene glycol  17 g  Oral Daily  . potassium chloride  40 mEq Oral Daily  . sodium chloride flush  3 mL Intravenous Q12H  . sodium chloride flush  3 mL Intravenous Q12H   Continuous: . sodium chloride     ZOX:[id number]:acetaminophen **OR** acetaminophen, albuterol, bisacodyl, magnesium hydroxide, ondansetron (ZOFRAN) IV, sodium chloride flush  Assessment/Plan:  Active Problems:   Hypothyroidism   Essential hypertension   Chronic atrial fibrillation (HCC)   Asthma   Cardiac arrest (HCC)   Atrial fibrillation with rapid ventricular response (HCC)   Insulin dependent diabetes mellitus (HCC)    Recent Cardiac arrest with VT/VF/elevated troponin This occurred while she was at [facility]. Patient was transferred here for further cardiology input. This episode occurred in the setting of A. fib with RVR and pulmonary edema. Patient was in sinus rhythm but converted to atrial fibrillation sometime this morning. Cardiology is adjusting her medications. Her troponins were elevated at [facility]. No further intervention is planned at this time.  Atrial fibrillation, chronic. She has a known history of atrial fibrillation and has been on oral anticoagulation. She was admitted with RVR to  [facility] a few days ago. She required cardioversion and then had to be placed on amiodarone with which she converted to sinus rhythm. Cardiology recommended continuing amiodarone despite prolonged QT interval. She is noted to be back in atrial fibrillation with RVR this morning. Perhaps because she did not take any of her medications yesterday as she was agitated and confused. Cardiology making changes to her medications. Hopefully she will convert back to sinus rhythm soon. Echocardiogram was done at [facility] and showed EF of 55-60%. Hypokinetic apical anterior and apical anterior wall was seen. Mild to moderate aortic regurgitation. Mild aortic stenosis was noted. Moderate to severe tricuspid regurgitation was noted. RV systolic pressure was 74 mmHg. Management per cardiology. She is also on metoprolol.   Acute encephalopathy/acute delirium Patient's mental status has improved this morning. She does not appear to be hallucinating anymore. She does not have any focal neurological deficits. UA did not show any infection. CT head did not show any acute findings. Patient could have underlying cognitive impairment. Dementia workup was ordered. B-12 level was 1726. TSH 5.3, but free T4 normal at 1.1. RPR is nonreactive. HIV is nonreactive. Ammonia level was normal. Discussed the side effects of amiodarone with cardiology and they did not think that amiodarone is responsible for her mental status changes. We discontinued all nonessential medications including Singulair. We cannot give her any Haldol or other psychotropic medications due to prolonged QT interval. She was given a low dose Ativan yesterday with which she slept all day and all night. Symptoms are most likely due to acute hospitalization and acute illness. Since she appears to be better today, we will hold off on any further testing for now.  Acute pulmonary edema Resolved. Secondary to A. fib with RVR. She was given diuretics with  improvement. Continue oral Lasix.  Insulin-dependent diabetes mellitus. Continue long-acting insulin and SSI. Monitor CBGs. CBGs remains poorly controlled. Check HbA1c. Levemir dose was increased last night. Continue to monitor trends today. She may need further increase in dose.   Chronic kidney disease, unknown stage. Apparently her creatinine at the time of her [facility] was about 1.5. Creatinine is slightly higher this morning. Likely due to poor oral intake yesterday. Gently hydrate. Continue to monitor urine output. Monitor renal function closely. UA was done at the time of admission and did not suggest any infection. Moderate  hemoglobin with 6-30 RBCs. No protein. She was hyperkalemic last night. She is noted to be on ACE inhibitor, which has been held for now. Await stabilization of renal function before resuming ACE inhibitor.  History of essential hypertension Monitor blood pressures closely. Continue current medications.   History of hypothyroidism TSH likely high, but free T4 is normal. Continue home medications. Will need repeat testing in 3-4 weeks.  DVT Prophylaxis: On Apixaban    Code Status: Full code  Family Communication: Discussed with the patient's husband at bedside.  Disposition Plan: PT and OT evaluation. Will likely need short-term rehabilitation at skilled nursing facility at the time of discharge.    LOS: 3 days   [facility]  [facility] Pager [phone] 02/16/2016, 9:02 AM  If 7PM-7AM, please contact night-coverage at [email or web address], password [facility]

## 2016-02-17 DIAGNOSIS — I10 Essential (primary) hypertension: Secondary | ICD-10-CM

## 2016-02-17 DIAGNOSIS — G9340 Encephalopathy, unspecified: Secondary | ICD-10-CM | POA: Insufficient documentation

## 2016-02-17 DIAGNOSIS — I469 Cardiac arrest, cause unspecified: Secondary | ICD-10-CM

## 2016-02-17 LAB — CBC
HEMATOCRIT: 48.3 % — AB (ref 36.0–46.0)
HEMOGLOBIN: 16.2 g/dL — AB (ref 12.0–15.0)
MCH: 31.1 pg (ref 26.0–34.0)
MCHC: 33.5 g/dL (ref 30.0–36.0)
MCV: 92.7 fL (ref 78.0–100.0)
Platelets: 284 10*3/uL (ref 150–400)
RBC: 5.21 MIL/uL — AB (ref 3.87–5.11)
RDW: 14.1 % (ref 11.5–15.5)
WBC: 11.1 10*3/uL — AB (ref 4.0–10.5)

## 2016-02-17 LAB — BASIC METABOLIC PANEL
ANION GAP: 10 (ref 5–15)
BUN: 54 mg/dL — ABNORMAL HIGH (ref 6–20)
CHLORIDE: 100 mmol/L — AB (ref 101–111)
CO2: 27 mmol/L (ref 22–32)
Calcium: 9 mg/dL (ref 8.9–10.3)
Creatinine, Ser: 1.83 mg/dL — ABNORMAL HIGH (ref 0.44–1.00)
GFR calc non Af Amer: 25 mL/min — ABNORMAL LOW (ref >60)
GFR, EST AFRICAN AMERICAN: 29 mL/min — AB (ref >60)
GLUCOSE: 67 mg/dL (ref 65–99)
POTASSIUM: 4 mmol/L (ref 3.5–5.1)
Sodium: 137 mmol/L (ref 135–145)

## 2016-02-17 LAB — FOLATE RBC
Folate, Hemolysate: 509.9 ng/mL
Folate, RBC: 1028 ng/mL (ref >498)
Hematocrit: 49.6 % — ABNORMAL HIGH (ref 34.0–46.6)

## 2016-02-17 LAB — GLUCOSE, CAPILLARY
GLUCOSE-CAPILLARY: 158 mg/dL — AB (ref 65–99)
GLUCOSE-CAPILLARY: 220 mg/dL — AB (ref 65–99)
GLUCOSE-CAPILLARY: 344 mg/dL — AB (ref 65–99)
Glucose-Capillary: 41 mg/dL — CL (ref 65–99)
Glucose-Capillary: 374 mg/dL — ABNORMAL HIGH (ref 65–99)

## 2016-02-17 LAB — HEMOGLOBIN A1C
Hgb A1c MFr Bld: 7.9 % — ABNORMAL HIGH (ref 4.8–5.6)
MEAN PLASMA GLUCOSE: 180 mg/dL

## 2016-02-17 MED ORDER — SODIUM CHLORIDE 0.9 % IV SOLN: 250.0000 mL | INTRAVENOUS | Status: DC

## 2016-02-17 MED ORDER — SODIUM CHLORIDE 0.9 % IV SOLN
250.0000 mL | INTRAVENOUS | Status: DC
  Administered 2016-02-17: 250 mL via INTRAVENOUS

## 2016-02-17 MED ORDER — INSULIN DETEMIR 100 UNIT/ML ~~LOC~~ SOLN
15.0000 [IU] | Freq: Every day | SUBCUTANEOUS | Status: DC
  Administered 2016-02-17: 15 [IU] via SUBCUTANEOUS
  Filled 2016-02-17 (×2): qty 0.15

## 2016-02-17 MED ORDER — METOPROLOL TARTRATE 25 MG PO TABS
25.0000 mg | ORAL_TABLET | Freq: Two times a day (BID) | ORAL | Status: DC
  Administered 2016-02-17 – 2016-02-18 (×2): 25 mg via ORAL
  Filled 2016-02-17 (×3): qty 1

## 2016-02-17 MED ORDER — DEXTROSE 50 % IV SOLN
INTRAVENOUS | Status: AC
  Administered 2016-02-17: 50 mL
  Filled 2016-02-17: qty 50

## 2016-02-17 NOTE — Clinical Social Work Note (Signed)
Clinical Social Work Assessment  Patient Details  Name: Ann [name] MRN: [id number] Date of Birth: [birth year]  Date of referral:  02/17/16               Reason for consult:  Facility Placement                Permission sought to share information with:  Family Supports Permission granted to share information::  Yes, Verbal Permission Granted  Name::     [name]  Relationship::  Son  Contact Information:  [phone]  Housing/Transportation Living arrangements for the past 2 months:  Single Family Home Source of Information:  Adult Children Patient Interpreter Needed:  None Criminal Activity/Legal Involvement Pertinent to Current Situation/Hospitalization:  No - Comment as needed Significant Relationships:  Adult Children, Spouse Lives with:  Spouse Do you feel safe going back to the place where you live?  Yes Need for family participation in patient care:  Yes (Comment)  Care giving concerns:  Patient son and daughter in law at bedside and state that patient will only discharge to [facility] and there are no other options for placement.  Patient family has connection with the facility and feels that patient will have no issues getting a bed.   Social Worker assessment / plan:  Holiday representative met with patient, patient son and patient daughter in law at bedside to offer support and discuss patient needs at discharge.  Patient family states that patient is home with her husband and is now agreeable to ST-SNF stay at [facility].  Patient family has already made a connection with the facility.  CSW to provide appropriate documentation and secure the bed for discharge on 05/23.  Patient family in agreement and request ambulance transport at discharge.  CSW remains available for support and to facilitate patient discharge needs once medically stable.  Employment status:  Retired Surveyor, [employer] Care PT Recommendations:  [place] / Referral to community resources:  [place]  Patient/Family's Response to care:  Patient and family agreeable to SNF at [facility] only.  Patient family verbalizes understanding and appreciation for CSW role and involvement in patient placement process.  Patient/Family's Understanding of and Emotional Response to Diagnosis, Current Treatment, and Prognosis:  Patient and family with good understanding of patient limitations and need for additional support at discharge.  Patient and family fully plan to have patient return home once physically able to assist.  Emotional Assessment Appearance:  Appears stated age Attitude/Demeanor/Rapport:  Inconsistent Affect (typically observed):  Calm, Pleasant Orientation:  Oriented to Self, Oriented to Place, Oriented to Situation Alcohol / Substance use:  Not Applicable Psych involvement (Current and /or in the community):  No (Comment)  Discharge Needs  Concerns to be addressed:  No discharge needs identified Readmission within the last 30 days:  No Current discharge risk:  None Barriers to Discharge:  Continued Medical Work up  [employer], [place]

## 2016-02-17 NOTE — Progress Notes (Signed)
Hypoglycemic Event  CBG: 41  Treatment: D50 IV 50 mLand breakfast meal tray  Symptoms: Sweaty  Follow-up CBG: ZOXW:[id number]:[id number] CBG Result:158  Possible Reasons for Event: Medication regiment  Comments/MD notified:Dr. Rito EhrlichKrishnan paged    Ann NovemberKalombo, Ann [name] C

## 2016-02-17 NOTE — Progress Notes (Signed)
       Patient Name: Ann [name] Date of Encounter: 02/17/2016    SUBJECTIVE: Overall the patient has no complaints. She does have significant memory difficulty. Last night was a more restful night according to the nursing staff.  TELEMETRY:  Atrial fibrillation has resolved. Back in sinus rhythm/sinus bradycardia. Filed Vitals:   02/16/16 1654 02/16/16 2150 02/16/16 2244 02/17/16 0500  BP: 105/53 128/67 123/60 130/45  Pulse: 68 63 60 60  Temp:  98.2 F (36.8 C)  97.8 F (36.6 C)  TempSrc:  Oral  Oral  Resp:  18  18  Height:      Weight:    167 lb 8 oz (75.978 kg)  SpO2:  96%      Intake/Output Summary (Last 24 hours) at 02/17/16 0838 Last data filed at 02/17/16 0600  Gross per 24 hour  Intake   1660 ml  Output    800 ml  Net    860 ml   LABS: Basic Metabolic Panel:  Recent Labs  16/06/9604/20/17 0559 02/16/16 0436 02/17/16 0453  NA 133* 135 137  K 3.9 4.6 4.0  CL 87* 93* 100*  CO2 33* 29 27  GLUCOSE 267* 164* 67  BUN 53* 54* 54*  CREATININE 1.94* 2.06* 1.83*  CALCIUM 9.6 9.4 9.0  MG 2.3  --   --    CBC:  Recent Labs  02/16/16 0436 02/17/16 0453  WBC 13.2* 11.1*  HGB 16.2* 16.2*  HCT 49.0* 48.3*  MCV 94.6 92.7  PLT 290 284   Cardiac Enzymes: No results for input(s): CKTOTAL, CKMB, CKMBINDEX, TROPONINI in the last 72 hours. BNP: Invalid input(s): POCBNP Hemoglobin A1C:  Recent Labs  02/16/16 0436  HGBA1C 7.9*   Fasting Lipid Panel: No results for input(s): CHOL, HDL, LDLCALC, TRIG, CHOLHDL, LDLDIRECT in the last 72 hours.  Radiology/Studies:  No new data  Physical Exam: Blood pressure 130/45, pulse 60, temperature 97.8 F (36.6 C), temperature source Oral, resp. rate 18, height 5\' 6"  (1.676 m), weight 167 lb 8 oz (75.978 kg), SpO2 96 %. Weight change: 7 lb 4.8 oz (3.311 kg)  Wt Readings from Last 3 Encounters:  02/17/16 167 lb 8 oz (75.978 kg)  03/03/11 171 lb 3.2 oz (77.656 kg)  04/29/10 174 lb 3 oz (79.011 kg)    Chest with  rhonchi. Cardiac with soft systolic murmur.  ASSESSMENT:  1. PAF with rhythm control on amiodarone and reduced dose metoprolol.  Plan:  1. Ready for DC and recommend decrease the AMIODARONE to 200 mg BID for 2 weeks then 200 mf daily. 2. F/U with primary cardiologist in [facility] in <2 weeks.  Signed, Lyn RecordsHenry W Smith III 02/17/2016, 8:38 AM

## 2016-02-17 NOTE — Progress Notes (Signed)
Pt profile: 82 year old female had admission to [facility] 02/09/16 when she had a syncopal event while sitting. She does not remember any prodrome. When she awoke, her husband was on the phone and calling 911. On EMS arrival, she was in AF with RVR and transported to the hospital. She was cardioverted to SR from AF in the ER and placed on amiodarone. She was placed on Bipap for a short time for pulmonary edema and diuresed. She then developed bradycardia and her amiodarone was discontinued. She subsequently had a VF arrest per notes (no strips available). Per the chart, she received chest compressions for a short time and then had ROSC. Again, there are not strips available for review. She was placed back on amiodarone and transferred to [facility] for [facility] evaluation.  Troponin was elevated on admission at 2.12 and trended down during hospitalization.  Echocardiogram 02/09/16 demonstrated EF 55-60%, apical anterior and inferior hypokinesis, mild to moderate AR, mild AS, moderate to severe TR.  EP has seen. Per  Dr. Johney Frame "Dr Wille Glaser and I agree that given no symptoms of ischemic, advanced age, and renal failure that cath should also be avoided. I would recommend caution with eliquis and would switch to coumadin if creatinine worsens further. I would advise restarting amiodarone  daily."   Pt very hard of hearing.   Subjective: No chest pain and no SOB  Objective: Vital signs in last 24 hours: Temp:  [97.8 F (36.6 C)-98.4 F (36.9 C)] 97.8 F (36.6 C) (05/22 0500) Pulse Rate:  [60-88] 60 (05/22 0500) Resp:  [18] 18 (05/22 0500) BP: (99-130)/(45-67) 130/45 mmHg (05/22 0500) SpO2:  [96 %] 96 % (05/21 2150) Weight:  [167 lb 8 oz (75.978 kg)] 167 lb 8 oz (75.978 kg) (05/22 0500) Weight change: 7 lb 4.8 oz (3.311 kg) Last BM Date: 02/16/16 Intake/Output from previous day: +1100 05/21 0701 - 05/22 0700 In: 1900 [P.O.:1080; I.V.:820] Out: 800 [Urine:800] Intake/Output  this shift:    PE: General:Pleasant affect, NAD Skin:Warm and dry, brisk capillary refill HEENT:normocephalic, sclera clear, mucus membranes moist Heart:S1S2 RRR with 2/6 systolic murmur, no gallup, rub or click Lungs:clear, ant.  without rales, rhonchi, or wheezes ONG:EXBM, non tender, + BS, do not palpate liver spleen or masses Ext:no lower ext edema, 2+ pedal pulses, 2+ radial pulses Neuro:alert and oriented to person place and time, MAE, follows commands, + facial symmetry Tele:  SB converted from A fib during the night  Lab Results:  Recent Labs  02/16/16 0436 02/17/16 0453  WBC 13.2* 11.1*  HGB 16.2* 16.2*  HCT 49.0* 48.3*  PLT 290 284   BMET  Recent Labs  02/16/16 0436 02/17/16 0453  NA 135 137  K 4.6 4.0  CL 93* 100*  CO2 29 27  GLUCOSE 164* 67  BUN 54* 54*  CREATININE 2.06* 1.83*  CALCIUM 9.4 9.0   No results for input(s): TROPONINI in the last 72 hours.  Invalid input(s): CK, MB  No results found for: CHOL, HDL, LDLCALC, LDLDIRECT, TRIG, CHOLHDL Lab Results  Component Value Date   HGBA1C 7.9* 04/29/2010     Lab Results  Component Value Date   TSH 5.384* 02/15/2016    Hepatic Function Panel  Recent Labs  02/15/16 0559  PROT 7.1  ALBUMIN 3.0*  AST 38  ALT 25  ALKPHOS 85  BILITOT 1.2   No results for input(s): CHOL in the last 72 hours. No results for input(s): PROTIME in the last 72  hours.     Studies/Results: No results found.  Medications: I have reviewed the patient's current medications. Scheduled Meds: . amiodarone  200 mg Oral TID  . apixaban  2.5 mg Oral BID  . atorvastatin  40 mg Oral Daily  . Chlorhexidine Gluconate Cloth  6 each Topical Q0600  . docusate sodium  100 mg Oral BID  . feeding supplement (GLUCERNA SHAKE)  237 mL Oral BID BM  . furosemide  40 mg Oral Daily  . insulin aspart  0-15 Units Subcutaneous TID WC  . insulin aspart  0-5 Units Subcutaneous QHS  . insulin detemir  18 Units Subcutaneous QHS  .  levothyroxine  150 mcg Oral QAC breakfast  . metoprolol tartrate  25 mg Oral TID  . multivitamin with minerals  1 tablet Oral Daily  . mupirocin ointment  1 application Nasal BID  . polyethylene glycol  17 g Oral Daily  . potassium chloride  40 mEq Oral Daily  . sodium chloride flush  3 mL Intravenous Q12H  . sodium chloride flush  3 mL Intravenous Q12H   Continuous Infusions: . sodium chloride 250 mL (02/16/16 1900)   PRN Meds:.acetaminophen **OR** acetaminophen, albuterol, bisacodyl, magnesium hydroxide, ondansetron (ZOFRAN) IV, sodium chloride flush  Assessment/Plan: Active Problems:   Hypothyroidism   Essential hypertension   Chronic atrial fibrillation (HCC)   Asthma   Cardiac arrest (HCC)   Atrial fibrillation with rapid ventricular response (HCC)   Insulin dependent diabetes mellitus (HCC)  1.  Hx persistent a fib and initial a fib with RVR now SB in 50s. On amiodarone 200mg  TID due to PAF yesterday and Eliquis- converted to SR around MN burst of PAF around 3 AM. May need amiodarone at higher dose for a week depending on Qtc and rate.   2. NSTEMI Troponin peak at [facility] was >2. Likely related to AF with RVR Dr Johney Frame discussed with Dr Josiah Lobo - would defer ischemic eval at this time Baseline creatinine around 1.6 per labs from [facility] (in paper chart) Continue BB No ASA with need for Eliquis  3. ?cardiac arrest No strips available for review Her AF was very fast and could have been aberration In the setting of NSTEMI, guidelines would suggest medical therapy   4. Prolonged QT Avoid QT prolonging drugs Keep K >3.9 Mg >1.8  5. CKD IV Cr today 1.83   LOS: 4 days   Time spent with pt. : 15 minutes. Nada Boozer  Nurse Practitioner Certified Pager [phone] or after 5pm and on weekends call [phone] 02/17/2016, 7:55 AM   The patient has been seen in conjunction with Nada Boozer, NP. All aspects of care have been considered and discussed.  The patient has been personally interviewed, examined, and all clinical data has been reviewed.   The entire hospitalization and his been reviewed. This is a difficult situation in a patient with dementia, questionable cardiac arrest, paroxysmal atrial fibrillation with RVR and acute diastolic heart failure, stage IV chronic kidney disease and non-ST elevation MI.  I agree with prior consultants that I management should be relatively conservative. I am pleased that she is back in sinus rhythm on amiodarone. I have concern that with Foley loaded with amiodarone she may have significant bradycardia. For the time being I would continue the slightly higher dose of amiodarone but prior to discharge she would need to be on no more than 200 mg twice a day for 2 weeks then down to 200 mg daily. Decrease metoprolol  to 25 mg twice a day.

## 2016-02-17 NOTE — Clinical Social Work Placement (Signed)
   CLINICAL SOCIAL WORK PLACEMENT  NOTE  Date:  02/17/2016  Patient Details  Name: Lorenso QuarryMolly H Truszkowski MRN: [id number] Date of Birth: 1933/04/01  Clinical Social Work is seeking post-discharge placement for this patient at the Skilled  Nursing Facility level of care (*CSW will initial, date and re-position this form in  chart as items are completed):  Yes   Patient/family provided with [facility] Clinical Social Work Department's list of facilities offering this level of care within the geographic area requested by the patient (or if unable, by the patient's family).  Yes   Patient/family informed of their freedom to choose among providers that offer the needed level of care, that participate in Medicare, Medicaid or managed care program needed by the patient, have an available bed and are willing to accept the patient.  Yes   Patient/family informed of [facility]'s ownership interest in [facility] and [facility], as well as of the fact that they are under no obligation to receive care at these facilities.  PASRR submitted to EDS on 02/17/16     PASRR number received on 02/17/16     Existing PASRR number confirmed on       FL2 transmitted to all facilities in geographic area requested by pt/family on 02/17/16     FL2 transmitted to all facilities within larger geographic area on       Patient informed that his/her managed care company has contracts with or will negotiate with certain facilities, including the following:        Yes   Patient/family informed of bed offers received.  [facility]     Physician recommends and patient chooses bed at      Patient to be transferred to [facility] [place] on 02/18/16.  Patient to be transferred to facility by Ambulance     Patient family notified on   of transfer.  Name of family member notified:        PHYSICIAN Please sign FL2, Please prepare priority discharge summary, including medications      Additional Comment:   Macario GoldsJesse Julissa Browning, LCSW [phone]

## 2016-02-17 NOTE — [place] FL2 (Signed)
[place] MEDICAID FL2 LEVEL OF CARE SCREENING TOOL     IDENTIFICATION  Patient Name: Ann [name] Birthdate: 1933/09/13 Sex: female Admission Date (Current Location): 02/13/2016  [facility] and [place] Number:  [employer] and Address:  [facility], [address], [place], [place] [address]      Provider Number: [id number]  Attending Physician Name and Address:  Osvaldo Shipper, MD  Relative Name and Phone Number:       Current Level of Care: Hospital Recommended Level of Care: Skilled Nursing Facility Prior Approval Number:    Date Approved/Denied:   PASRR Number: [id number]  Discharge Plan: SNF    Current Diagnoses: Patient Active Problem List   Diagnosis Date Noted  . Cardiac arrest (HCC) 02/13/2016  . Atrial fibrillation with rapid ventricular response (HCC) 02/13/2016  . Insulin dependent diabetes mellitus (HCC) 02/13/2016  . RHINOSINUSITIS, ACUTE 02/04/2010  . Asthma 11/03/2008  . Hypothyroidism 04/26/2007  . DIABETES MELLITUS, TYPE I 04/26/2007  . Essential hypertension 04/26/2007  . Chronic atrial fibrillation (HCC) 04/26/2007  . ALLERGIC RHINITIS 04/26/2007  . OSTEOPOROSIS 04/26/2007    Orientation RESPIRATION BLADDER Height & Weight     Self, Time, Place  Normal Incontinent Weight: 167 lb 8 oz (75.978 kg) Height:   (167.6 cm)  BEHAVIORAL SYMPTOMS/MOOD NEUROLOGICAL BOWEL NUTRITION STATUS   (NONE )  (NONE ) Continent Diet (CARB MODIFIED )  AMBULATORY STATUS COMMUNICATION OF NEEDS Skin   Limited Assist Verbally Normal                       Personal Care Assistance Level of Assistance  Bathing, Feeding, Dressing Bathing Assistance: Limited assistance Feeding assistance: Independent Dressing Assistance: Limited assistance     Functional Limitations Info  Sight, Hearing, Speech Sight Info: Adequate Hearing Info: Adequate Speech Info: Adequate    SPECIAL CARE FACTORS FREQUENCY  PT (By licensed PT)      PT Frequency: 3              Contractures      Additional Factors Info  Code Status, Allergies, Insulin Sliding Scale Code Status Info: FULL CODE  Allergies Info: Penicillins    Insulin Sliding Scale Info: 3x per day        Current Medications (02/17/2016):  This is the current hospital active medication list Current Facility-Administered Medications  Medication Dose Route Frequency Provider Last Rate Last Dose  . 0.9 %  sodium chloride infusion  250 mL Intravenous Continuous Osvaldo Shipper, MD 30 mL/hr at 02/17/16 0939 250 mL at 02/17/16 0939  . acetaminophen (TYLENOL) tablet 650 mg  650 mg Oral Q6H PRN Delano Metz, MD       Or  . acetaminophen (TYLENOL) suppository 650 mg  650 mg Rectal Q6H PRN Delano Metz, MD      . albuterol (PROVENTIL) (2.5 MG/3ML) 0.083% nebulizer solution 2.5 mg  2.5 mg Nebulization Q6H PRN Delano Metz, MD      . amiodarone (PACERONE) tablet 200 mg  200 mg Oral TID Othella Boyer, MD   200 mg at 02/17/16 0932  . apixaban (ELIQUIS) tablet 2.5 mg  2.5 mg Oral BID Delano Metz, MD   2.5 mg at 02/17/16 0932  . atorvastatin (LIPITOR) tablet 40 mg  40 mg Oral Daily Delano Metz, MD   40 mg at 02/17/16 0932  . bisacodyl (DULCOLAX) EC tablet 10 mg  10 mg Oral Daily PRN Delano Metz, MD      .  Chlorhexidine Gluconate Cloth 2 % PADS 6 each  6 each Topical Q0600 Delano Metzobert Schertz, MD   6 each at 02/17/16 [phone]  . docusate sodium (COLACE) capsule 100 mg  100 mg Oral BID Delano Metzobert Schertz, MD   100 mg at 02/17/16 0933  . feeding supplement (GLUCERNA SHAKE) (GLUCERNA SHAKE) liquid 237 mL  237 mL Oral BID BM Osvaldo ShipperGokul Krishnan, MD   237 mL at 02/17/16 1000  . furosemide (LASIX) tablet 40 mg  40 mg Oral Daily Osvaldo ShipperGokul Krishnan, MD   40 mg at 02/17/16 0932  . insulin aspart (novoLOG) injection 0-15 Units  0-15 Units Subcutaneous TID WC Delano Metzobert Schertz, MD   15 Units at 02/16/16 1713  . insulin aspart (novoLOG) injection 0-5 Units  0-5 Units Subcutaneous QHS Delano Metzobert  Schertz, MD   3 Units at 02/16/16 2247  . insulin detemir (LEVEMIR) injection 18 Units  18 Units Subcutaneous QHS Osvaldo ShipperGokul Krishnan, MD   18 Units at 02/16/16 2246  . levothyroxine (SYNTHROID, LEVOTHROID) tablet 150 mcg  150 mcg Oral QAC breakfast Delano Metzobert Schertz, MD   150 mcg at 02/17/16 [phone]  . magnesium hydroxide (MILK OF MAGNESIA) suspension 30 mL  30 mL Oral Daily PRN Delano Metzobert Schertz, MD      . metoprolol tartrate (LOPRESSOR) tablet 25 mg  25 mg Oral BID Osvaldo ShipperGokul Krishnan, MD   25 mg at 02/17/16 [phone]  . multivitamin with minerals tablet 1 tablet  1 tablet Oral Daily Delano Metzobert Schertz, MD   1 tablet at 02/17/16 0933  . mupirocin ointment (BACTROBAN) 2 % 1 application  1 application Nasal BID Delano Metzobert Schertz, MD   1 application at 02/17/16 [phone]  . ondansetron (ZOFRAN) injection 4 mg  4 mg Intravenous Q6H PRN Delano Metzobert Schertz, MD      . polyethylene glycol (MIRALAX / GLYCOLAX) packet 17 g  17 g Oral Daily Delano Metzobert Schertz, MD   17 g at 02/17/16 0933  . potassium chloride (K-DUR,KLOR-CON) CR tablet 40 mEq  40 mEq Oral Daily Delano Metzobert Schertz, MD   40 mEq at 02/17/16 0933  . sodium chloride flush (NS) 0.9 % injection 3 mL  3 mL Intravenous Q12H Delano Metzobert Schertz, MD   3 mL at 02/17/16 1000  . sodium chloride flush (NS) 0.9 % injection 3 mL  3 mL Intravenous Q12H Delano Metzobert Schertz, MD   3 mL at 02/16/16 1000  . sodium chloride flush (NS) 0.9 % injection 3 mL  3 mL Intravenous PRN Delano Metzobert Schertz, MD         Discharge Medications: Please see discharge summary for a list of discharge medications.  Relevant Imaging Results:  Relevant Lab Results:   Additional Information SSN [id number]  Derenda FennelBashira Kayal Mula, MSW, LCSWA [phone] 02/17/2016 12:38 PM

## 2016-02-17 NOTE — Progress Notes (Signed)
Inpatient Diabetes Program Recommendations  AACE/ADA: New Consensus Statement on Inpatient Glycemic Control (2015)  Target Ranges:  Prepandial:   less than 140 mg/dL      Peak postprandial:   less than 180 mg/dL (1-2 hours)      Critically ill patients:  140 - 180 mg/dL   Results for Ann [name], Ann [name] (MRN [id number]) as of 02/17/2016 11:04  Ref. Range 02/16/2016 08:11 02/16/2016 11:55 02/16/2016 16:19 02/16/2016 21:55  Glucose-Capillary Latest Ref Range: 65-99 mg/dL 409174 ([name]) 811294 ([name]) 914396 ([name]) 281 ([name])   Results for Ann [name], Ann [name] (MRN [id number]) as of 02/17/2016 11:04  Ref. Range 02/17/2016 07:46 02/17/2016 08:27  Glucose-Capillary Latest Ref Range: 65-99 mg/dL 41 (LL) 086158 ([name])    Home DM Meds: Levemir 16-22 units QHS       Humalog 2-6 units tid with meals  Current Insulin Orders: Levemir 18 units QHS      Novolog Moderate Correction Scale/ SSI (0-15 units) TID AC + HS       -Patient with Hypoglycemic episode this AM (CBG 41 mg/dl) and patient was symptomatic per nursing notes.  -Eating 50-90% of meals and having issues with postprandial glucose elevations as well.     MD- Please consider the following ins-hospital insulin adjustments:  1. Reduce Levemir to 15 units QHS  2. Reduce Novolog Correction Scale/ SSI to Sensitive scale (0-9 units) TID AC + HS (currently ordered as Moderate scale 0-15 units)  3. Start Novolog Meal Coverage- Novolog 4 units tid with meals (hold if pt eats <50% of meal)      --Will follow patient during hospitalization--  Ambrose FinlandJeannine Johnston Ahnika Hannibal RN, MSN, CDE Diabetes Coordinator Inpatient Glycemic Control Team Team Pager: [phone] (8a-5p)

## 2016-02-17 NOTE — Progress Notes (Signed)
[facility] using SBAR format, reviewed VS, meds, tests and patient's generaql condition, assumed care of patient.

## 2016-02-17 NOTE — Progress Notes (Signed)
Physical Therapy Treatment Patient Details Name: Ann QuarryMolly H Petro MRN: [id number] DOB: Mar 31, 1933 Today's Date: 02/17/2016    History of Present Illness 82 y.o. female [facility] for seizure like episode, found to be in A-fib with RVR rates in the 160-180s.  Underwent DCCVx2 and converted to NSR.  She was admitted to ICU hwere she had actue pulmonary edema, started on IV lasix and BiPap.  On 5/15 she had some runs of VT/VF and had a code blue.  [facility].  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      PT Comments    Pt is making headway with gait and balance, asking to use BR and [facility].  Her efforts to get walking and moving are excellent.  Should be able to transition to the PT at SNF with steady recovery of standing balance and control of hips.  Follow Up Recommendations  SNF     Equipment Recommendations  Rolling walker with 5" wheels;Wheelchair cushion (measurements PT)    Recommendations for Other Services       Precautions / Restrictions Precautions Precautions: Fall (telemetry) Precaution Comments: low vision, HOH Restrictions Weight Bearing Restrictions: No    Mobility  Bed Mobility Overal bed mobility: Needs Assistance Bed Mobility: Supine to Sit     Supine to sit: Min assist Sit to supine: Min assist      Transfers Overall transfer level: Needs assistance Equipment used: Rolling walker (2 wheeled);1 person hand held assist Transfers: Sit to/from [employer]Stand;Stand Pivot Transfers Sit to Stand: Min assist;Min guard Stand pivot transfers: Min guard;Min assist          Ambulation/Gait Ambulation/Gait assistance: Min assist Ambulation Distance (Feet): 5 Feet Assistive device: Rolling walker (2 wheeled);1 person hand held assist Gait Pattern/deviations: Step-through pattern;Step-to pattern;Shuffle;Wide base of  support;Trunk flexed Gait velocity: reduced Gait velocity interpretation: Below normal speed for age/gender General Gait Details: cues as pt cannot see or hear well   Stairs            Wheelchair Mobility    Modified Rankin (Stroke Patients Only)       Balance Overall balance assessment: Needs assistance Sitting-balance support: Feet supported Sitting balance-Leahy Scale: Fair   Postural control: Posterior lean Standing balance support: Bilateral upper extremity supported Standing balance-Leahy Scale: Poor                      Cognition Arousal/Alertness: Awake/alert Behavior During Therapy: WFL for tasks assessed/performed Overall Cognitive Status: Within Functional Limits for tasks assessed   Orientation Level: Disoriented to;Time Current Attention Level: Focused Memory: Decreased short-term memory Following Commands: Follows one step commands with increased time Safety/Judgement: Decreased awareness of deficits Awareness: Intellectual Problem Solving: Difficulty sequencing;Decreased initiation;Slow processing;Requires verbal cues;Requires tactile cues General Comments: EOB more stable today    Exercises General Exercises - Lower Extremity Ankle Circles/Pumps: AROM;Both;15 reps Quad Sets: AROM;Both;15 reps Hip ABduction/ADduction: AROM;Both;15 reps    General Comments General comments (skin integrity, edema, etc.): Pt is able to be more independent than last visit and showing potential to make a great deal of progress      Pertinent Vitals/Pain Pain Assessment: No/denies pain    Home Living                      Prior Function  PT Goals (current goals can now be found in the care plan section) Acute Rehab PT Goals Patient Stated Goal: Pt. wants to get stronger and d/c home. Progress towards PT goals: Progressing toward goals    Frequency  Min 3X/week    PT Plan Current plan remains appropriate    Co-evaluation              End of Session Equipment Utilized During Treatment: Gait belt Activity Tolerance: Patient limited by fatigue Patient left: in bed;with call bell/phone within reach;with bed alarm set;with family/visitor present     Time: 1610-9604 PT Time Calculation (min) (ACUTE ONLY): 25 min  Charges:  $Gait Training: 8-22 mins $Therapeutic Exercise: 8-22 mins                    G Codes:      Ivar Drape 03-06-16, 1:48 PM   Samul Dada, PT MS Acute Rehab Dept. Number: [facility] [id number] and [facility] [phone]

## 2016-02-17 NOTE — Progress Notes (Signed)
[facility] PROGRESS NOTE  Ann QuarryMolly H Eckels UJW:[id number] DOB: Dec 22, 1932 DOA: 02/13/2016  PCP: Marlow BaarsKendall Garing, MD  Brief HPI: 82 year old Caucasian female with a past medical history of insulin-dependent diabetes, atrial fibrillation on anticoagulation, hypothyroidism, presented to the emergency department at [facility] on Saturday, May 13 with a seizure-like episode. She was found to have atrial fibrillation with RVR at a rate of 160. She was admitted to the hospital. She underwent cardioversion and converted to sinus rhythm. She was noted to have pulmonary edema and was started on Lasix. She was seen by cardiology over at [facility]. She was also given amiodarone due to recurrence of A. fib and she converted to sinus rhythm. And then she had an episode of cardiac arrest with the VT/VF. ACLS protocol was followed and the patient had return of spontaneous circulation. Discussions were held with electrophysiologist here at [facility]. The patient was transferred for further workup.  Past medical history:  Past Medical History  Diagnosis Date  . Dyslipidemia   . Osteoporosis, unspecified   . Unspecified hypothyroidism   . Unspecified essential hypertension   . Type I (juvenile type) diabetes mellitus without mention of complication, not stated as uncontrolled   . Atrial fibrillation (HCC)   . Allergic rhinitis, cause unspecified     Consultants: Cardiology  Procedures: None  Antibiotics: None  Subjective: Patient feels well this morning. Her daughter-in-law is at the bedside. Patient denies any shortness of breath or chest pain. No nausea or vomiting.    Objective:  Vital Signs  Filed Vitals:   02/16/16 1654 02/16/16 2150 02/16/16 2244 02/17/16 0500  BP: 105/53 128/67 123/60 130/45  Pulse: 68 63 60 60  Temp:  98.2 F (36.8 C)  97.8 F (36.6 C)  TempSrc:  Oral  Oral  Resp:  18  18  Height:      Weight:    75.978 kg (167 lb 8 oz)  SpO2:  96%       Intake/Output Summary (Last 24 hours) at 02/17/16 0923 Last data filed at 02/17/16 0600  Gross per 24 hour  Intake   1660 ml  Output    800 ml  Net    860 ml   Filed Weights   02/15/16 0500 02/16/16 0500 02/17/16 0500  Weight: 70.943 kg (156 lb 6.4 oz) 72.666 kg (160 lb 3.2 oz) 75.978 kg (167 lb 8 oz)    General appearance: Awake and alert. In no distress. Resp: Clear to auscultation bilaterally. No wheezing, rales or rhonchi. Cardio: S1, S2 is regular. Bradycardic. No S3, S4. No rubs, murmurs or bruit. No pedal edema.  GI: soft, non-tender; bowel sounds normal; no masses,  no organomegaly Neurologic: Awake and alert this morning. No longer agitated. No facial asymmetry. Moving all extremities.    Lab Results:  Data Reviewed: I have personally reviewed following labs and imaging studies  CBC:  Recent Labs Lab 02/13/16 2052 02/15/16 0559 02/16/16 0436 02/17/16 0453  WBC 9.4 10.8* 13.2* 11.1*  NEUTROABS 7.2  --   --   --   HGB 16.8* 16.6* 16.2* 16.2*  HCT 49.8* 49.6* 49.0* 48.3*  MCV 93.8 93.9 94.6 92.7  PLT 248 277 290 284   Basic Metabolic Panel:  Recent Labs Lab 02/13/16 2052 02/14/16 0643 02/15/16 0559 02/16/16 0436 02/17/16 0453  NA 133* 136 133* 135 137  K 5.3* 4.4 3.9 4.6 4.0  CL 86* 92* 87* 93* 100*  CO2 33* 32 33* 29 27  GLUCOSE 303*  224* 267* 164* 67  BUN 51* 52* 53* 54* 54*  CREATININE 2.31* 2.00* 1.94* 2.06* 1.83*  CALCIUM 9.3 9.4 9.6 9.4 9.0  MG  --   --  2.3  --   --   PHOS 3.2  --   --   --   --    GFR: Estimated Creatinine Clearance: 25.5 mL/min (by C-G formula based on Cr of 1.83).  Liver Function Tests:  Recent Labs Lab 02/13/16 2052 02/15/16 0559  AST 34 38  ALT 26 25  ALKPHOS 89 85  BILITOT 1.6* 1.2  PROT 7.2 7.1  ALBUMIN 3.0* 3.0*   CBG:  Recent Labs Lab 02/16/16 1155 02/16/16 1619 02/16/16 2155 02/17/16 0746 02/17/16 0827  GLUCAP 294* 396* 281* 41* 158*   Urine analysis:    Component Value Date/Time    COLORURINE YELLOW 02/13/2016 2234   APPEARANCEUR CLEAR 02/13/2016 2234   LABSPEC 1.014 02/13/2016 2234   PHURINE 5.5 02/13/2016 2234   GLUCOSEU NEGATIVE 02/13/2016 2234   HGBUR MODERATE* 02/13/2016 2234   BILIRUBINUR NEGATIVE 02/13/2016 2234   KETONESUR NEGATIVE 02/13/2016 2234   PROTEINUR NEGATIVE 02/13/2016 2234   NITRITE NEGATIVE 02/13/2016 2234   LEUKOCYTESUR NEGATIVE 02/13/2016 2234    Recent Results (from the past 240 hour(s))  MRSA PCR Screening     Status: Abnormal   Collection Time: 02/13/16  6:53 PM  Result Value Ref Range Status   MRSA by PCR POSITIVE (A) NEGATIVE Final    Comment:        The GeneXpert MRSA Assay (FDA approved for NASAL specimens only), is one component of a comprehensive MRSA colonization surveillance program. It is not intended to diagnose MRSA infection nor to guide or monitor treatment for MRSA infections. RESULT CALLED TO, READ BACK BY AND VERIFIED WITH: H MOOSE RN 2240 02/13/16 A BROWNING       Radiology Studies: No results found.   Medications:  Scheduled: . amiodarone  200 mg Oral TID  . apixaban  2.5 mg Oral BID  . atorvastatin  40 mg Oral Daily  . Chlorhexidine Gluconate Cloth  6 each Topical Q0600  . docusate sodium  100 mg Oral BID  . feeding supplement (GLUCERNA SHAKE)  237 mL Oral BID BM  . furosemide  40 mg Oral Daily  . insulin aspart  0-15 Units Subcutaneous TID WC  . insulin aspart  0-5 Units Subcutaneous QHS  . insulin detemir  18 Units Subcutaneous QHS  . levothyroxine  150 mcg Oral QAC breakfast  . metoprolol tartrate  25 mg Oral BID  . multivitamin with minerals  1 tablet Oral Daily  . mupirocin ointment  1 application Nasal BID  . polyethylene glycol  17 g Oral Daily  . potassium chloride  40 mEq Oral Daily  . sodium chloride flush  3 mL Intravenous Q12H  . sodium chloride flush  3 mL Intravenous Q12H   Continuous: . sodium chloride     NFA:OZHYQMVHQIONG **OR** acetaminophen, albuterol, bisacodyl,  magnesium hydroxide, ondansetron (ZOFRAN) IV, sodium chloride flush  Assessment/Plan:  Active Problems:   Hypothyroidism   Essential hypertension   Chronic atrial fibrillation (HCC)   Asthma   Cardiac arrest (HCC)   Atrial fibrillation with rapid ventricular response (HCC)   Insulin dependent diabetes mellitus (HCC)    Recent Cardiac arrest with VT/VF/elevated troponin This occurred while she was at [facility]. Patient was transferred to [facility] for further cardiology input. This episode occurred in the setting of A. fib with  RVR and pulmonary edema. Patient was in sinus rhythm but converted to atrial fibrillation sometime this morning. Cardiology is adjusting her medications. Her troponins were elevated at [facility]. No further intervention is planned at this time. Seen by electrophysiology.  Atrial fibrillation, chronic. She has a known history of atrial fibrillation and has been on oral anticoagulation. She was admitted with RVR to [facility] a few days ago. She required cardioversion and then had to be placed on amiodarone with which she converted to sinus rhythm. Cardiology recommended continuing amiodarone despite prolonged QT interval. Patient was back in atrial fibrillation with RVR on 5/21. This was because she had not taken her medications. The previous day. Medications were resumed and now she is back in sinus rhythm. Heart rate is noted to be slow. Discussed with Dr. Katrinka Blazing this morning. We'll cut back on dose of metoprolol. Leave her on current dose of amiodarone for now and will decrease to twice a day dosing from tomorrow when she is discharged. Echocardiogram was done at [facility] and showed EF of 55-60%. Hypokinetic apical anterior and apical anterior wall was seen. Mild to moderate aortic regurgitation. Mild aortic stenosis was noted. Moderate to severe tricuspid regurgitation was noted. RV systolic pressure was 74 mmHg. Management per  cardiology.   Acute encephalopathy/acute delirium Patient is back to baseline. She does not have any further hallucinations. States that she does get nightmares at times. UA did not show any infection. CT head did not show any acute findings. Patient could have underlying cognitive impairment. Dementia workup was ordered. B-12 level was 1726. TSH 5.3, but free T4 normal at 1.1. RPR is nonreactive. HIV is nonreactive. Ammonia level was normal. Discussed the side effects of amiodarone with cardiology and they did not think that amiodarone is responsible for her mental status changes. We discontinued all nonessential medications including Singulair. We could not give her any Haldol or other psychotropic medications due to prolonged QT interval. She was given a low dose Ativan yesterday with which she slept all day and all night. Symptoms are most likely due to acute hospitalization and acute illness.   Acute pulmonary edema Resolved. Secondary to A. fib with RVR. She was given diuretics with improvement. Continue oral Lasix.  Insulin-dependent diabetes mellitus. Continue long-acting insulin and SSI. Monitor CBGs. CBGs are fluctuating due to poor oral intake. HbA1c is 7.9.   Chronic kidney disease, unknown stage. Apparently her creatinine at the time of her admission to [facility] was about 1.5. Creatinine was high yesterday. Improved this morning. Continue to monitor urine output. Monitor renal function closely. UA was done at the time of admission and did not suggest any infection. Moderate hemoglobin with 6-30 RBCs. No protein. She was hyperkalemic last night. She is noted to be on ACE inhibitor, which has been held for now. Await stabilization of renal function before resuming ACE inhibitor. Would prefer this be addressed as outpatient.  History of essential hypertension Monitor blood pressures closely. Continue current medications.   History of hypothyroidism TSH likely high, but free T4 is normal.  Continue home medications. Will need repeat testing in 3-4 weeks.  DVT Prophylaxis: On Apixaban    Code Status: Full code  Family Communication: Discussed with the patient and her daughter-in-law. Disposition Plan: Physical Therapy recommended SNF. Anticipate discharge tomorrow.    LOS: 4 days   [facility]  [facility] Pager [phone] 02/17/2016, 9:23 AM  If 7PM-7AM, please contact night-coverage at [email or web address], password [facility]

## 2016-02-18 DIAGNOSIS — E038 Other specified hypothyroidism: Secondary | ICD-10-CM | POA: Diagnosis not present

## 2016-02-18 DIAGNOSIS — I480 Paroxysmal atrial fibrillation: Secondary | ICD-10-CM | POA: Diagnosis not present

## 2016-02-18 DIAGNOSIS — I10 Essential (primary) hypertension: Secondary | ICD-10-CM | POA: Diagnosis not present

## 2016-02-18 DIAGNOSIS — I5032 Chronic diastolic (congestive) heart failure: Secondary | ICD-10-CM | POA: Diagnosis not present

## 2016-02-18 DIAGNOSIS — E119 Type 2 diabetes mellitus without complications: Secondary | ICD-10-CM | POA: Diagnosis not present

## 2016-02-18 DIAGNOSIS — I509 Heart failure, unspecified: Secondary | ICD-10-CM | POA: Diagnosis not present

## 2016-02-18 DIAGNOSIS — I4891 Unspecified atrial fibrillation: Secondary | ICD-10-CM | POA: Diagnosis not present

## 2016-02-18 DIAGNOSIS — E039 Hypothyroidism, unspecified: Secondary | ICD-10-CM | POA: Diagnosis not present

## 2016-02-18 DIAGNOSIS — I482 Chronic atrial fibrillation: Secondary | ICD-10-CM | POA: Diagnosis not present

## 2016-02-18 DIAGNOSIS — I469 Cardiac arrest, cause unspecified: Secondary | ICD-10-CM | POA: Diagnosis not present

## 2016-02-18 DIAGNOSIS — R262 Difficulty in walking, not elsewhere classified: Secondary | ICD-10-CM | POA: Diagnosis not present

## 2016-02-18 DIAGNOSIS — E118 Type 2 diabetes mellitus with unspecified complications: Secondary | ICD-10-CM | POA: Diagnosis not present

## 2016-02-18 DIAGNOSIS — E1165 Type 2 diabetes mellitus with hyperglycemia: Secondary | ICD-10-CM | POA: Diagnosis not present

## 2016-02-18 DIAGNOSIS — J45909 Unspecified asthma, uncomplicated: Secondary | ICD-10-CM | POA: Diagnosis not present

## 2016-02-18 DIAGNOSIS — N184 Chronic kidney disease, stage 4 (severe): Secondary | ICD-10-CM | POA: Diagnosis not present

## 2016-02-18 DIAGNOSIS — N179 Acute kidney failure, unspecified: Secondary | ICD-10-CM | POA: Diagnosis not present

## 2016-02-18 LAB — CBC
HEMATOCRIT: 43.9 % (ref 36.0–46.0)
HEMOGLOBIN: 14.1 g/dL (ref 12.0–15.0)
MCH: 30.7 pg (ref 26.0–34.0)
MCHC: 32.1 g/dL (ref 30.0–36.0)
MCV: 95.6 fL (ref 78.0–100.0)
Platelets: 302 10*3/uL (ref 150–400)
RBC: 4.59 MIL/uL (ref 3.87–5.11)
RDW: 14.2 % (ref 11.5–15.5)
WBC: 8.7 10*3/uL (ref 4.0–10.5)

## 2016-02-18 LAB — BASIC METABOLIC PANEL
Anion gap: 8 (ref 5–15)
BUN: 50 mg/dL — AB (ref 6–20)
CO2: 28 mmol/L (ref 22–32)
Calcium: 8.8 mg/dL — ABNORMAL LOW (ref 8.9–10.3)
Chloride: 98 mmol/L — ABNORMAL LOW (ref 101–111)
Creatinine, Ser: 1.85 mg/dL — ABNORMAL HIGH (ref 0.44–1.00)
GFR calc Af Amer: 29 mL/min — ABNORMAL LOW (ref >60)
GFR calc non Af Amer: 25 mL/min — ABNORMAL LOW (ref >60)
GLUCOSE: 406 mg/dL — AB (ref 65–99)
POTASSIUM: 5.1 mmol/L (ref 3.5–5.1)
Sodium: 134 mmol/L — ABNORMAL LOW (ref 135–145)

## 2016-02-18 LAB — GLUCOSE, CAPILLARY
Glucose-Capillary: 377 mg/dL — ABNORMAL HIGH (ref 65–99)
Glucose-Capillary: 308 mg/dL — ABNORMAL HIGH (ref 65–99)

## 2016-02-18 MED ORDER — INSULIN DETEMIR 100 UNIT/ML FLEXPEN: 18.0000 [IU] | PEN_INJECTOR | Freq: Every day | SUBCUTANEOUS | Status: DC

## 2016-02-18 MED ORDER — FUROSEMIDE 40 MG PO TABS: 40.0000 mg | ORAL_TABLET | Freq: Every day | ORAL | Status: DC

## 2016-02-18 MED ORDER — GLUCERNA SHAKE PO LIQD: 237.0000 mL | Freq: Two times a day (BID) | ORAL | Status: DC

## 2016-02-18 MED ORDER — AMIODARONE HCL 200 MG PO TABS
200.0000 mg | ORAL_TABLET | Freq: Two times a day (BID) | ORAL | Status: DC
  Administered 2016-02-18: 200 mg via ORAL
  Filled 2016-02-18: qty 1

## 2016-02-18 MED ORDER — POLYETHYLENE GLYCOL 3350 17 G PO PACK: 17.0000 g | PACK | Freq: Every day | ORAL | Status: DC

## 2016-02-18 MED ORDER — ATORVASTATIN CALCIUM 40 MG PO TABS: 40.0000 mg | ORAL_TABLET | Freq: Every day | ORAL | Status: DC

## 2016-02-18 MED ORDER — BISACODYL 5 MG PO TBEC: 10.0000 mg | DELAYED_RELEASE_TABLET | Freq: Every day | ORAL | Status: DC | PRN

## 2016-02-18 MED ORDER — METOPROLOL TARTRATE 25 MG PO TABS
25.0000 mg | ORAL_TABLET | Freq: Two times a day (BID) | ORAL | Status: DC
Start: 2016-02-18 — End: 2016-03-10

## 2016-02-18 MED ORDER — DOCUSATE SODIUM 100 MG PO CAPS: 100.0000 mg | ORAL_CAPSULE | Freq: Two times a day (BID) | ORAL | Status: DC

## 2016-02-18 MED ORDER — APIXABAN 2.5 MG PO TABS: 2.5000 mg | ORAL_TABLET | Freq: Two times a day (BID) | ORAL | Status: DC

## 2016-02-18 MED ORDER — AMIODARONE HCL 200 MG PO TABS
ORAL_TABLET | ORAL | Status: DC
Start: 2016-02-18 — End: 2016-03-10

## 2016-02-18 NOTE — Progress Notes (Signed)
Updated report received via Patent examinerCesar RN using SBAR format, reviewed VS, new orders and today's events, assumed care of patient.

## 2016-02-18 NOTE — Clinical Social Work Placement (Signed)
   CLINICAL SOCIAL WORK PLACEMENT  NOTE  Date:  02/18/2016  Patient Details  Name: Ann [name] MRN: [id number] Date of Birth: 07/30/1933  Clinical Social Work is seeking post-discharge placement for this patient at the Skilled  Nursing Facility level of care (*CSW will initial, date and re-position this form in  chart as items are completed):  Yes   Patient/family provided with [facility] Clinical Social Work Department's list of facilities offering this level of care within the geographic area requested by the patient (or if unable, by the patient's family).  Yes   Patient/family informed of their freedom to choose among providers that offer the needed level of care, that participate in Medicare, Medicaid or managed care program needed by the patient, have an available bed and are willing to accept the patient.  Yes   Patient/family informed of [facility]'s ownership interest in [facility] and [facility], as well as of the fact that they are under no obligation to receive care at these facilities.  PASRR submitted to EDS on 02/17/16     PASRR number received on 02/17/16     Existing PASRR number confirmed on       FL2 transmitted to all facilities in geographic area requested by pt/family on 02/17/16     FL2 transmitted to all facilities within larger geographic area on       Patient informed that his/her managed care company has contracts with or will negotiate with certain facilities, including the following:        Yes   Patient/family informed of bed offers received.  Patient [facility] bed at [facility]     Physician recommends and patient chooses bed at      Patient to be transferred to [facility], [place] on 02/18/16.  Patient to be transferred to facility by Ambulance     Patient family notified on 02/18/16 of transfer.  Name of family member notified:  Moody BruinsScott Haselton (patient husband over the phone)     PHYSICIAN Please sign FL2, Please prepare  priority discharge summary, including medications     Additional Comment:    Macario GoldsJesse Heraclio Seidman, LCSW [phone]

## 2016-02-18 NOTE — Clinical Social Work Note (Signed)
Clinical Social Worker facilitated patient discharge including contacting patient family and facility to confirm patient discharge plans.  Clinical information faxed to facility and family agreeable with plan.  CSW arranged ambulance transport via [facility] to [facility] .  RN to call report prior to discharge.  Clinical Social Worker will sign off for now as social work intervention is no longer needed. Please consult us again if new need arises.  Jesse Raymonda Pell, LCSW [phone] 

## 2016-02-18 NOTE — Care Management Important Message (Signed)
Important Message  Patient Details  Name: Ann [name] MRN: [id number] Date of Birth: 02-19-1933   Medicare Important Message Given:  Yes    Gala LewandowskyGraves-Bigelow, Erlean Mealor Kaye, RN 02/18/2016, 11:27 AM

## 2016-02-18 NOTE — Discharge Summary (Signed)
[facility]  Physician Discharge Summary   Patient ID: Ann [name] MRN: [id number] DOB/AGE: 05-12-1933 82 y.o.  Admit date: 02/13/2016 Discharge date: 02/18/2016  PCP: Marlow Baars, MD  DISCHARGE DIAGNOSES:  Active Problems:   Hypothyroidism   Essential hypertension   Chronic atrial fibrillation (HCC)   Asthma   Cardiac arrest (HCC)   Atrial fibrillation with rapid ventricular response (HCC)   Insulin dependent diabetes mellitus (HCC)   Acute encephalopathy   RECOMMENDATIONS FOR OUTPATIENT FOLLOW UP: CBGs will need to be monitored closely and insulin dose may have to be adjusted further.  CBC and basic metabolic panel in 3 days.  Please pay attention to the instructions on how to take amiodarone Check TSH and free T4 in 3 weeks.   DISCHARGE CONDITION: fair  Diet recommendation: Modified carbohydrate  Filed Weights   02/16/16 0500 02/17/16 0500 02/18/16 0500  Weight: 72.666 kg (160 lb 3.2 oz) 75.978 kg (167 lb 8 oz) 73.8 kg (162 lb 11.2 oz)    INITIAL HISTORY: 82 year old Caucasian female with a past medical history of insulin-dependent diabetes, atrial fibrillation on anticoagulation, hypothyroidism, presented to the emergency department at [facility] on Saturday, May 13 with a seizure-like episode. She was found to have atrial fibrillation with RVR at a rate of 160. She was admitted to the hospital. She underwent cardioversion and converted to sinus rhythm. She was noted to have pulmonary edema and was started on Lasix. She was seen by cardiology over at [facility]. She was also given amiodarone due to recurrence of A. fib and she converted to sinus rhythm. And then she had an episode of cardiac arrest with the VT/VF. ACLS protocol was followed and the patient had return of spontaneous circulation. Discussions were held with electrophysiologist here at [facility]. The patient was transferred for further  workup.  Consultations:  Cardiology  Procedures:  None  HOSPITAL COURSE:   Recent Cardiac arrest with VT/VF/elevated troponin This occurred while she was at [facility]. Patient was transferred to [facility] for further cardiology input. This episode occurred in the setting of A. fib with RVR and pulmonary edema. She was seen by cardiology. Her troponins were elevated at [facility]. No further intervention is planned at this time. Seen by electrophysiology as well.  Atrial fibrillation, chronic. She has a known history of atrial fibrillation and has been on oral anticoagulation. She was admitted with RVR to [facility] a few days ago. She required cardioversion and then had to be placed on amiodarone with which she converted to sinus rhythm. Cardiology recommended continuing amiodarone despite prolonged QT interval. Patient was back in atrial fibrillation with RVR on 5/21. This was because she had not taken her medications the previous day. Medications were resumed and now she is back in sinus rhythm. She was noted to be bradycardic yesterday and so her metoprolol dose was decreased. Amiodarone dose also decreased. This was discussed with cardiology. She is now on twice a day dosing of amiodarone. This will be changed to once a day after 1 week. Echocardiogram was done at [facility] and showed EF of 55-60%. Hypokinetic apical anterior and apical anterior wall was seen. Mild to moderate aortic regurgitation. Mild aortic stenosis was noted. Moderate to severe tricuspid regurgitation was noted. RV systolic pressure was 74 mmHg. She should be following with her outpatient cardiologist in week to 10 days.  Acute encephalopathy/acute delirium Patient experienced hallucinations and was noted to be delirious during the early part of this  hospitalization. Workup as mentioned below. It appears that her presentation was most likely due to acute illness and hospital stay. No  other etiology was found. Patient is back to baseline. She does not have any further hallucinations. States that she does get nightmares at times. UA did not show any infection. CT head did not show any acute findings. Patient could have underlying cognitive impairment. Dementia workup was ordered. B-12 level was 1726. TSH 5.3, but free T4 normal at 1.1. RPR is nonreactive. HIV is nonreactive. Ammonia level was normal. Discussed the side effects of amiodarone with cardiology and they did not think that amiodarone is responsible for her mental status changes. We discontinued all nonessential medications including Singulair. We could not give her any Haldol or other psychotropic medications due to prolonged QT interval. She was given a dose of Ativan with which she slept for many hours.   Acute pulmonary edema This has resolved.. Secondary to A. fib with RVR. She was given diuretics with improvement. Continue oral Lasix. She was also given scheduled potassium. Potassium level noted to be slightly on the higher side this morning. So, potassium discontinued. Repeat labs later this week.  Insulin-dependent diabetes mellitus. Patient has had poorly controlled diabetes. HbA1c is 7.9. Unclear why her blood sugar remained so high. Her CBGs will need to be monitored closely at the skilled nursing facility. Dose adjustments may have to be made to her insulin.   Chronic kidney disease, unknown stage. Apparently her creatinine at the time of her admission to [facility] was about 1.5. Creatinine was noted to be elevated in this hospital. Most likely due to poor oral intake while she was encephalopathic. Now improved. Will need to be monitored closely in the outpatient setting. Her ACE inhibitor has been discontinued for now. UA was done at the time of admission and did not suggest any infection. Moderate hemoglobin with 6-30 RBCs. No protein.   History of essential hypertension Monitor blood pressures closely. Continue  current medications.   History of hypothyroidism TSH likely high, but free T4 is normal. Continue home medications. Will need repeat testing in 3-4 weeks.  Overall, patient is much improved. Her mental status is back to baseline. Okay for discharge to SNF today.   PERTINENT LABS:  The results of significant diagnostics from this hospitalization (including imaging, microbiology, ancillary and laboratory) are listed below for reference.    Microbiology: Recent Results (from the past 240 hour(s))  MRSA PCR Screening     Status: Abnormal   Collection Time: 02/13/16  6:53 PM  Result Value Ref Range Status   MRSA by PCR POSITIVE (A) NEGATIVE Final    Comment:        The GeneXpert MRSA Assay (FDA approved for NASAL specimens only), is one component of a comprehensive MRSA colonization surveillance program. It is not intended to diagnose MRSA infection nor to guide or monitor treatment for MRSA infections. RESULT CALLED TO, READ BACK BY AND VERIFIED WITH: H MOOSE RN 2240 02/13/16 A BROWNING      Labs: Basic Metabolic Panel:  Recent Labs Lab 02/13/16 2052 02/14/16 1191 02/15/16 0559 02/16/16 0436 02/17/16 0453 02/18/16 0338  NA 133* 136 133* 135 137 134*  K 5.3* 4.4 3.9 4.6 4.0 5.1  CL 86* 92* 87* 93* 100* 98*  CO2 33* 32 33* GLUCOSE 303* 224* 267* 164* 67 406*  BUN 51* 52* 53* 54* 54* 50*  CREATININE 2.31* 2.00* 1.94* 2.06* 1.83* 1.85*  CALCIUM 9.3 9.4 9.6  9.4 9.0 8.8*  MG  --   --  2.3  --   --   --   PHOS 3.2  --   --   --   --   --    Liver Function Tests:  Recent Labs Lab 02/13/16 2052 02/15/16 0559  AST 34 38  ALT 26 25  ALKPHOS 89 85  BILITOT 1.6* 1.2  PROT 7.2 7.1  ALBUMIN 3.0* 3.0*    Recent Labs Lab 02/15/16 0919  AMMONIA 23   CBC:  Recent Labs Lab 02/13/16 2052 02/15/16 0559 02/16/16 0436 02/17/16 0453 02/18/16 0338  WBC 9.4 10.8* 13.2* 11.1* 8.7  NEUTROABS 7.2  --   --   --   --   HGB 16.8* 16.6* 16.2* 16.2* 14.1  HCT  49.8* 49.6*  49.6* 49.0* 48.3* 43.9  MCV 93.8 93.9 94.6 92.7 95.6  PLT 248 277 290 284 302   CBG:  Recent Labs Lab 02/17/16 0827 02/17/16 1144 02/17/16 1707 02/17/16 2155 02/18/16 0746  GLUCAP 158* 220* 374* 344* 377*     IMAGING STUDIES Ct Head Wo Contrast  02/14/2016  CLINICAL DATA:  Altered mental status EXAM: CT HEAD WITHOUT CONTRAST TECHNIQUE: Contiguous axial images were obtained from the base of the skull through the vertex without intravenous contrast. COMPARISON:  None. FINDINGS: No skull fracture is noted. Paranasal sinuses and mastoid air cells are unremarkable. No intracranial hemorrhage, mass effect or midline shift. No acute cortical infarction. No mass lesion is noted on this unenhanced scan. Mild cerebral atrophy. Mild periventricular and patchy subcortical white matter decreased attenuation probable due to chronic small vessel ischemic changes. Mild atherosclerotic calcifications of carotid siphon. No intra or extra-axial fluid collection. IMPRESSION: No acute intracranial abnormality. Mild cerebral atrophy. Periventricular and patchy subcortical white matter decreased attenuation probable due to chronic small vessel ischemic changes. No definite acute cortical infarction. Electronically Signed   By: Natasha Mead M.D.   On: 02/14/2016 13:23   Portable Chest 1 View  02/13/2016  CLINICAL DATA:  Atrial fibrillation with rapid ventricular response EXAM: PORTABLE CHEST 1 VIEW COMPARISON:  Portable exam 2032 hours without priors for comparison. FINDINGS: Borderline enlargement of cardiac silhouette. Probable mitral annular calcification. Atherosclerotic calcification aorta. Pulmonary vascularity normal. Bronchitic changes without pulmonary infiltrate, pleural effusion or pneumothorax. Bones unremarkable. IMPRESSION: Borderline enlargement of cardiac silhouette. Bronchitic changes without infiltrate. Electronically Signed   By: Ulyses Southward M.D.   On: 02/13/2016 20:46    DISCHARGE  EXAMINATION: Filed Vitals:   02/17/16 1948 02/17/16 2204 02/18/16 0500 02/18/16 1017  BP: 136/42 139/43 130/50 124/56  Pulse:  56 75 60  Temp: 99.1 F (37.3 C)  98.2 F (36.8 C)   TempSrc: Oral  Oral   Resp: 18  18   Height:      Weight:   73.8 kg (162 lb 11.2 oz)   SpO2: 94%  96%    General appearance: alert, cooperative, appears stated age and no distress Resp: clear to auscultation bilaterally Cardio: regular rate and rhythm, S1, S2 normal, no murmur, click, rub or gallop GI: soft, non-tender; bowel sounds normal; no masses,  no organomegaly Extremities: extremities normal, atraumatic, no cyanosis or edema  DISPOSITION: SNF  Discharge Instructions    Call MD for:  difficulty breathing, headache or visual disturbances    Complete by:  As directed      Call MD for:  extreme fatigue    Complete by:  As directed      Call MD  for:  persistant dizziness or light-headedness    Complete by:  As directed      Call MD for:  persistant nausea and vomiting    Complete by:  As directed      Call MD for:  severe uncontrolled pain    Complete by:  As directed      Call MD for:  temperature >100.4    Complete by:  As directed      Diet - low sodium heart healthy    Complete by:  As directed      Diet Carb Modified    Complete by:  As directed      Discharge instructions    Complete by:  As directed   Please be sure to follow-up with your cardiologist within a week to 10 days. CBGs will need to be monitored closely and insulin dose may have to be adjusted further. CBC and basic metabolic panel in 3 days. Please pay attention to the instructions on how to take amiodarone.  You were cared for by a hospitalist during your hospital stay. If you have any questions about your discharge medications or the care you received while you were in the hospital after you are discharged, you can call the unit and asked to speak with the hospitalist on call if the hospitalist that took care of you is not  available. Once you are discharged, your primary care physician will handle any further medical issues. Please note that NO REFILLS for any discharge medications will be authorized once you are discharged, as it is imperative that you return to your primary care physician (or establish a relationship with a primary care physician if you do not have one) for your aftercare needs so that they can reassess your need for medications and monitor your lab values. If you do not have a primary care physician, you can call [phone] for a physician referral.     Increase activity slowly    Complete by:  As directed            ALLERGIES:  Allergies  Allergen Reactions  . Penicillins     Has patient had a PCN reaction causing immediate rash, facial/tongue/throat swelling, SOB or lightheadedness with hypotension: No Has patient had a PCN reaction causing severe rash involving mucus membranes or skin necrosis: No Has patient had a PCN reaction that required hospitalization No Has patient had a PCN reaction occurring within the last 10 years: Yes If all of the above answers are "NO", then may proceed with Cephalosporin use.      Current Discharge Medication List    START taking these medications   Details  amiodarone (PACERONE) 200 MG tablet Take 200mg  twice daily for 1 week and then take 200mg  once daily.    bisacodyl (DULCOLAX) 5 MG EC tablet Take 2 tablets (10 mg total) by mouth daily as needed for moderate constipation. Qty: 30 tablet, Refills: 0    docusate sodium (COLACE) 100 MG capsule Take 1 capsule (100 mg total) by mouth 2 (two) times daily. Qty: 10 capsule, Refills: 0    feeding supplement, GLUCERNA SHAKE, (GLUCERNA SHAKE) LIQD Take 237 mLs by mouth 2 (two) times daily between meals. Refills: 0    furosemide (LASIX) 40 MG tablet Take 1 tablet (40 mg total) by mouth daily. Qty: 30 tablet    metoprolol tartrate (LOPRESSOR) 25 MG tablet Take 1 tablet (25 mg total) by mouth 2 (two)  times daily.    polyethylene glycol (  MIRALAX / GLYCOLAX) packet Take 17 g by mouth daily. Qty: 14 each, Refills: 0      CONTINUE these medications which have CHANGED   Details  apixaban (ELIQUIS) 2.5 MG TABS tablet Take 1 tablet (2.5 mg total) by mouth 2 (two) times daily.    atorvastatin (LIPITOR) 40 MG tablet Take 1 tablet (40 mg total) by mouth daily.    Insulin Detemir (LEVEMIR FLEXPEN) 100 UNIT/ML Pen Inject 18 Units into the skin daily at 10 pm. Qty: 15 mL, Refills: 11      CONTINUE these medications which have NOT CHANGED   Details  albuterol (PROVENTIL HFA;VENTOLIN HFA) 108 (90 BASE) MCG/ACT inhaler Inhale 2 puffs into the lungs every 6 (six) hours as needed for wheezing. Qty: 1 Inhaler, Refills: 5    calcium citrate-vitamin D (CITRACAL+D) 315-200 MG-UNIT tablet Take 1 tablet by mouth daily.    Cholecalciferol (VITAMIN D3) 2000 units capsule Take 1 capsule by mouth daily.    fluticasone (FLONASE) 50 MCG/ACT nasal spray Place 2 sprays into both nostrils at bedtime.    glucose blood test strip 1 each by Other route as needed. Use as instructed     insulin lispro (HUMALOG) 100 UNIT/ML injection Inject 2-6 Units into the skin 3 (three) times daily with meals. Sliding scale    Insulin Pen Needle (PEN NEEDLES 31GX5/16") 31G X 8 MM MISC Use as directed    Multiple Vitamin (MULTIVITAMIN) capsule Take 1 capsule by mouth daily.      Polyethyl Glycol-Propyl Glycol 0.4-0.3 % SOLN Place 1-2 drops into both eyes daily as needed.     levothyroxine (SYNTHROID, LEVOTHROID) 137 MCG tablet Take 137 mcg by mouth daily before breakfast.      STOP taking these medications     chlorthalidone (HYGROTON) 25 MG tablet      diltiazem (CARDIZEM CD) 240 MG 24 hr capsule      guanFACINE (TENEX) 2 MG tablet      lisinopril (PRINIVIL,ZESTRIL) 10 MG tablet      montelukast (SINGULAIR) 10 MG tablet      Multiple Vitamins-Minerals (ICAPS LUTEIN & OMEGA-3) CAPS      potassium chloride  (K-DUR,KLOR-CON) 10 MEQ tablet      ranitidine (ZANTAC) 150 MG tablet      vitamin C (ASCORBIC ACID) 500 MG tablet        Follow-up Information    Follow up with Marlow BaarsKendall Garing, MD. Schedule an appointment as soon as possible for a visit in 2 weeks.   Specialty:  Family Medicine   Why:  post hospitalization follow up   Contact information:   [address] [place] [address] [phone]       Follow up with Garwin BrothersEVANKAR,RAJAN R, MD. Schedule an appointment as soon as possible for a visit in 1 week.   Specialty:  Cardiology   Why:  post hospitalization follow up   Contact information:   [address] [place] [address] [phone]       TOTAL DISCHARGE TIME: 35 minutes  [facility] - BentonvilleKRISHNAN,Jaeanna Mccomber  [facility] Pager [phone]  02/18/2016, 11:11 AM

## 2016-02-18 NOTE — Care Management Note (Signed)
Case Management Note  Patient Details  Name: Ann [name] MRN: [id number] Date of Birth: 12-Dec-1932  Subjective/Objective:    Pt admitted for Hypertension. [facility]                 Action/Plan: CSW assisting with disposition needs. No further needs from CM at this time.    Expected Discharge Date:                  Expected Discharge Plan:  Skilled Nursing Facility  In-House Referral:  Clinical Social Work  Discharge planning Services  CM Consult  Post Acute Care Choice:  NA Choice offered to:  NA  DME Arranged:  N/A DME Agency:  NA  HH Arranged:  NA HH Agency:  NA  Status of Service:  Completed, signed off  Medicare Important Message Given:  Yes Date Medicare IM Given:    Medicare IM give by:    Date Additional Medicare IM Given:    Additional Medicare Important Message give by:     If discussed at Long Length of Stay Meetings, dates discussed:    Additional Comments:  Gala LewandowskyGraves-Bigelow, Chasiti Waddington Kaye, RN 02/18/2016, 11:46 AM

## 2016-02-18 NOTE — Progress Notes (Signed)
Inpatient Diabetes Program Recommendations  AACE/ADA: New Consensus Statement on Inpatient Glycemic Control (2015)  Target Ranges:  Prepandial:   less than 140 mg/dL      Peak postprandial:   less than 180 mg/dL (1-2 hours)      Critically ill patients:  140 - 180 mg/dL  Results for Ann [name], Forever H (MRN [id number]) as of 02/18/2016 11:24  Ref. Range 02/17/2016 11:44 02/17/2016 17:07 02/17/2016 21:55 02/18/2016 07:46 02/18/2016 11:19  Glucose-Capillary Latest Ref Range: 65-99 mg/dL 409220 (H) 811374 (H) 914344 (H) 377 (H) 308 (H)   Review of Glycemic Control  Home DM Meds: Levemir 16-22 units QHS  Humalog 2-6 units tid with meals  Current Insulin Orders: Levemir 18 units QHS  Novolog Moderate Correction Scale/ SSI (0-15 units) TID AC + HS  Inpatient Diabetes Program Recommendations:  Noted CBGS elevated, especially postprandial.  Please consider: Adding Novolog 4 units meal coverage tid and decrease Novolog correction scale to sensitive 0-9 units tid.  Thank you, Billy FischerJudy E. Shadoe Cryan, RN, MSN, CDE Inpatient Glycemic Control Team Team Pager [phone] (8am-5pm) 02/18/2016 11:28 AM

## 2016-02-20 DIAGNOSIS — R262 Difficulty in walking, not elsewhere classified: Secondary | ICD-10-CM | POA: Diagnosis not present

## 2016-02-20 DIAGNOSIS — I4891 Unspecified atrial fibrillation: Secondary | ICD-10-CM | POA: Diagnosis not present

## 2016-02-20 DIAGNOSIS — N179 Acute kidney failure, unspecified: Secondary | ICD-10-CM | POA: Diagnosis not present

## 2016-02-20 DIAGNOSIS — E1165 Type 2 diabetes mellitus with hyperglycemia: Secondary | ICD-10-CM | POA: Diagnosis not present

## 2016-02-21 DIAGNOSIS — I509 Heart failure, unspecified: Secondary | ICD-10-CM | POA: Diagnosis not present

## 2016-02-28 DIAGNOSIS — N184 Chronic kidney disease, stage 4 (severe): Secondary | ICD-10-CM | POA: Diagnosis not present

## 2016-02-28 DIAGNOSIS — I480 Paroxysmal atrial fibrillation: Secondary | ICD-10-CM | POA: Diagnosis not present

## 2016-02-28 DIAGNOSIS — I10 Essential (primary) hypertension: Secondary | ICD-10-CM | POA: Diagnosis not present

## 2016-02-28 DIAGNOSIS — I5032 Chronic diastolic (congestive) heart failure: Secondary | ICD-10-CM | POA: Diagnosis not present

## 2016-03-05 DIAGNOSIS — I482 Chronic atrial fibrillation: Secondary | ICD-10-CM | POA: Diagnosis not present

## 2016-03-05 DIAGNOSIS — J45909 Unspecified asthma, uncomplicated: Secondary | ICD-10-CM | POA: Diagnosis not present

## 2016-03-05 DIAGNOSIS — E11319 Type 2 diabetes mellitus with unspecified diabetic retinopathy without macular edema: Secondary | ICD-10-CM | POA: Diagnosis not present

## 2016-03-05 DIAGNOSIS — R2681 Unsteadiness on feet: Secondary | ICD-10-CM | POA: Diagnosis not present

## 2016-03-05 DIAGNOSIS — Z7901 Long term (current) use of anticoagulants: Secondary | ICD-10-CM | POA: Diagnosis not present

## 2016-03-05 DIAGNOSIS — L89312 Pressure ulcer of right buttock, stage 2: Secondary | ICD-10-CM | POA: Diagnosis not present

## 2016-03-05 DIAGNOSIS — Z8674 Personal history of sudden cardiac arrest: Secondary | ICD-10-CM | POA: Diagnosis not present

## 2016-03-05 DIAGNOSIS — Z794 Long term (current) use of insulin: Secondary | ICD-10-CM | POA: Diagnosis not present

## 2016-03-05 DIAGNOSIS — I10 Essential (primary) hypertension: Secondary | ICD-10-CM | POA: Diagnosis not present

## 2016-03-08 ENCOUNTER — Encounter (HOSPITAL_COMMUNITY): Payer: Self-pay

## 2016-03-08 ENCOUNTER — Inpatient Hospital Stay (HOSPITAL_COMMUNITY)
Admission: EM | Admit: 2016-03-08 | Discharge: 2016-03-10 | DRG: 281 | Disposition: A | Discharge status: Hospice | Payer: Medicare Other | Attending: Cardiovascular Disease | Admitting: Cardiovascular Disease

## 2016-03-08 ENCOUNTER — Encounter (HOSPITAL_COMMUNITY)
Admission: EM | Disposition: A | Discharge status: Hospice | Payer: Self-pay | Source: Home / Self Care | Attending: Cardiovascular Disease

## 2016-03-08 DIAGNOSIS — R0789 Other chest pain: Secondary | ICD-10-CM | POA: Diagnosis not present

## 2016-03-08 DIAGNOSIS — I129 Hypertensive chronic kidney disease with stage 1 through stage 4 chronic kidney disease, or unspecified chronic kidney disease: Secondary | ICD-10-CM | POA: Diagnosis present

## 2016-03-08 DIAGNOSIS — I080 Rheumatic disorders of both mitral and aortic valves: Secondary | ICD-10-CM | POA: Diagnosis not present

## 2016-03-08 DIAGNOSIS — I959 Hypotension, unspecified: Secondary | ICD-10-CM | POA: Diagnosis present

## 2016-03-08 DIAGNOSIS — E039 Hypothyroidism, unspecified: Secondary | ICD-10-CM | POA: Diagnosis not present

## 2016-03-08 DIAGNOSIS — I2101 ST elevation (STEMI) myocardial infarction involving left main coronary artery: Secondary | ICD-10-CM | POA: Diagnosis not present

## 2016-03-08 DIAGNOSIS — I213 ST elevation (STEMI) myocardial infarction of unspecified site: Secondary | ICD-10-CM

## 2016-03-08 DIAGNOSIS — Z515 Encounter for palliative care: Secondary | ICD-10-CM | POA: Diagnosis not present

## 2016-03-08 DIAGNOSIS — N179 Acute kidney failure, unspecified: Secondary | ICD-10-CM | POA: Diagnosis present

## 2016-03-08 DIAGNOSIS — Z880 Allergy status to penicillin: Secondary | ICD-10-CM | POA: Diagnosis not present

## 2016-03-08 DIAGNOSIS — I2102 ST elevation (STEMI) myocardial infarction involving left anterior descending coronary artery: Principal | ICD-10-CM | POA: Diagnosis present

## 2016-03-08 DIAGNOSIS — E1022 Type 1 diabetes mellitus with diabetic chronic kidney disease: Secondary | ICD-10-CM | POA: Diagnosis present

## 2016-03-08 DIAGNOSIS — Z794 Long term (current) use of insulin: Secondary | ICD-10-CM

## 2016-03-08 DIAGNOSIS — Z79899 Other long term (current) drug therapy: Secondary | ICD-10-CM | POA: Diagnosis not present

## 2016-03-08 DIAGNOSIS — I2584 Coronary atherosclerosis due to calcified coronary lesion: Secondary | ICD-10-CM | POA: Diagnosis not present

## 2016-03-08 DIAGNOSIS — E1065 Type 1 diabetes mellitus with hyperglycemia: Secondary | ICD-10-CM | POA: Diagnosis not present

## 2016-03-08 DIAGNOSIS — Z66 Do not resuscitate: Secondary | ICD-10-CM | POA: Diagnosis present

## 2016-03-08 DIAGNOSIS — I272 Other secondary pulmonary hypertension: Secondary | ICD-10-CM | POA: Diagnosis not present

## 2016-03-08 DIAGNOSIS — E785 Hyperlipidemia, unspecified: Secondary | ICD-10-CM | POA: Diagnosis present

## 2016-03-08 DIAGNOSIS — M810 Age-related osteoporosis without current pathological fracture: Secondary | ICD-10-CM | POA: Diagnosis present

## 2016-03-08 DIAGNOSIS — H9190 Unspecified hearing loss, unspecified ear: Secondary | ICD-10-CM | POA: Diagnosis present

## 2016-03-08 DIAGNOSIS — J309 Allergic rhinitis, unspecified: Secondary | ICD-10-CM | POA: Diagnosis not present

## 2016-03-08 DIAGNOSIS — I481 Persistent atrial fibrillation: Secondary | ICD-10-CM | POA: Diagnosis present

## 2016-03-08 DIAGNOSIS — N184 Chronic kidney disease, stage 4 (severe): Secondary | ICD-10-CM | POA: Diagnosis not present

## 2016-03-08 DIAGNOSIS — I2510 Atherosclerotic heart disease of native coronary artery without angina pectoris: Secondary | ICD-10-CM

## 2016-03-08 DIAGNOSIS — R0600 Dyspnea, unspecified: Secondary | ICD-10-CM | POA: Diagnosis not present

## 2016-03-08 DIAGNOSIS — Z7901 Long term (current) use of anticoagulants: Secondary | ICD-10-CM | POA: Diagnosis not present

## 2016-03-08 HISTORY — PX: CARDIAC CATHETERIZATION: SHX172

## 2016-03-08 LAB — POCT I-STAT, CHEM 8
BUN: 71 mg/dL — ABNORMAL HIGH (ref 6–20)
CALCIUM ION: 1.2 mmol/L (ref 1.13–1.30)
Chloride: 98 mmol/L — ABNORMAL LOW (ref 101–111)
Creatinine, Ser: 2.7 mg/dL — ABNORMAL HIGH (ref 0.44–1.00)
GLUCOSE: 235 mg/dL — AB (ref 65–99)
HCT: 34 % — ABNORMAL LOW (ref 36.0–46.0)
HEMOGLOBIN: 11.6 g/dL — AB (ref 12.0–15.0)
Potassium: 3.8 mmol/L (ref 3.5–5.1)
Sodium: 136 mmol/L (ref 135–145)
TCO2: 23 mmol/L (ref 0–100)

## 2016-03-08 LAB — COMPREHENSIVE METABOLIC PANEL
ALK PHOS: 63 U/L (ref 38–126)
ALT: 38 U/L (ref 14–54)
ANION GAP: 13 (ref 5–15)
AST: 41 U/L (ref 15–41)
Albumin: 2.8 g/dL — ABNORMAL LOW (ref 3.5–5.0)
BILIRUBIN TOTAL: 0.8 mg/dL (ref 0.3–1.2)
BUN: 71 mg/dL — ABNORMAL HIGH (ref 6–20)
CALCIUM: 9.2 mg/dL (ref 8.9–10.3)
CO2: 23 mmol/L (ref 22–32)
Chloride: 99 mmol/L — ABNORMAL LOW (ref 101–111)
Creatinine, Ser: 2.65 mg/dL — ABNORMAL HIGH (ref 0.44–1.00)
GFR, EST AFRICAN AMERICAN: 18 mL/min — AB (ref >60)
GFR, EST NON AFRICAN AMERICAN: 16 mL/min — AB (ref >60)
GLUCOSE: 241 mg/dL — AB (ref 65–99)
Potassium: 3.7 mmol/L (ref 3.5–5.1)
Sodium: 135 mmol/L (ref 135–145)
TOTAL PROTEIN: 6.2 g/dL — AB (ref 6.5–8.1)

## 2016-03-08 LAB — LIPID PANEL
Cholesterol: 168 mg/dL (ref 0–200)
HDL: 49 mg/dL (ref >40)
LDL Cholesterol: 90 mg/dL (ref 0–99)
TRIGLYCERIDES: 146 mg/dL (ref <150)
Total CHOL/HDL Ratio: 3.4 RATIO
VLDL: 29 mg/dL (ref 0–40)

## 2016-03-08 LAB — GLUCOSE, CAPILLARY: GLUCOSE-CAPILLARY: 191 mg/dL — AB (ref 65–99)

## 2016-03-08 LAB — CBC WITH DIFFERENTIAL/PLATELET
Basophils Absolute: 0 10*3/uL (ref 0.0–0.1)
Basophils Relative: 0 %
EOS ABS: 0.1 10*3/uL (ref 0.0–0.7)
EOS PCT: 1 %
HCT: 34.1 % — ABNORMAL LOW (ref 36.0–46.0)
Hemoglobin: 10.9 g/dL — ABNORMAL LOW (ref 12.0–15.0)
LYMPHS ABS: 1.2 10*3/uL (ref 0.7–4.0)
LYMPHS PCT: 14 %
MCH: 31.4 pg (ref 26.0–34.0)
MCHC: 32 g/dL (ref 30.0–36.0)
MCV: 98.3 fL (ref 78.0–100.0)
MONO ABS: 0.5 10*3/uL (ref 0.1–1.0)
Monocytes Relative: 6 %
Neutro Abs: 6.6 10*3/uL (ref 1.7–7.7)
Neutrophils Relative %: 79 %
PLATELETS: 200 10*3/uL (ref 150–400)
RBC: 3.47 MIL/uL — ABNORMAL LOW (ref 3.87–5.11)
RDW: 16 % — AB (ref 11.5–15.5)
WBC: 8.3 10*3/uL (ref 4.0–10.5)

## 2016-03-08 LAB — PROTIME-INR
INR: 1.96 — ABNORMAL HIGH (ref 0.00–1.49)
PROTHROMBIN TIME: 22.2 s — AB (ref 11.6–15.2)

## 2016-03-08 LAB — TROPONIN I: TROPONIN I: 0.29 ng/mL — AB (ref <0.031)

## 2016-03-08 LAB — CK TOTAL AND CKMB (NOT AT ARMC)
CK TOTAL: 47 U/L (ref 38–234)
CK, MB: 3.8 ng/mL (ref 0.5–5.0)
Relative Index: INVALID (ref 0.0–2.5)

## 2016-03-08 LAB — APTT: aPTT: >200 seconds (ref 24–37)

## 2016-03-08 SURGERY — LEFT HEART CATH AND CORONARY ANGIOGRAPHY
Anesthesia: LOCAL

## 2016-03-08 MED ORDER — HEPARIN BOLUS VIA INFUSION: 4000.0000 [IU] | Freq: Once | INTRAVENOUS | Status: DC

## 2016-03-08 MED ORDER — HEPARIN SODIUM (PORCINE) 1000 UNIT/ML IJ SOLN
INTRAMUSCULAR | Status: AC
  Filled 2016-03-08: qty 1

## 2016-03-08 MED ORDER — FUROSEMIDE 40 MG PO TABS
40.0000 mg | ORAL_TABLET | Freq: Every day | ORAL | Status: DC
  Administered 2016-03-09 – 2016-03-10 (×2): 40 mg via ORAL
  Filled 2016-03-08 (×2): qty 1

## 2016-03-08 MED ORDER — GLUCERNA SHAKE PO LIQD: 237.0000 mL | Freq: Two times a day (BID) | ORAL | Status: DC

## 2016-03-08 MED ORDER — APIXABAN 2.5 MG PO TABS: 2.5000 mg | ORAL_TABLET | Freq: Two times a day (BID) | ORAL | Status: DC

## 2016-03-08 MED ORDER — MULTIVITAMINS PO CAPS: 1.0000 | ORAL_CAPSULE | Freq: Every day | ORAL | Status: DC

## 2016-03-08 MED ORDER — ALBUTEROL SULFATE HFA 108 (90 BASE) MCG/ACT IN AERS: 2.0000 | INHALATION_SPRAY | Freq: Four times a day (QID) | RESPIRATORY_TRACT | Status: DC | PRN

## 2016-03-08 MED ORDER — DOCUSATE SODIUM 100 MG PO CAPS
100.0000 mg | ORAL_CAPSULE | Freq: Two times a day (BID) | ORAL | Status: DC
  Filled 2016-03-08: qty 1

## 2016-03-08 MED ORDER — FLUTICASONE PROPIONATE 50 MCG/ACT NA SUSP
2.0000 | Freq: Every day | NASAL | Status: DC
  Administered 2016-03-08 – 2016-03-09 (×2): 2 via NASAL
  Filled 2016-03-08: qty 16

## 2016-03-08 MED ORDER — LIDOCAINE HCL (PF) 1 % IJ SOLN
INTRAMUSCULAR | Status: AC
  Filled 2016-03-08: qty 30

## 2016-03-08 MED ORDER — LIDOCAINE HCL (PF) 1 % IJ SOLN
INTRAMUSCULAR | Status: DC | PRN
  Administered 2016-03-08: 4 mL

## 2016-03-08 MED ORDER — VERAPAMIL HCL 2.5 MG/ML IV SOLN
INTRAVENOUS | Status: DC | PRN
  Administered 2016-03-08: 10 mL via INTRA_ARTERIAL

## 2016-03-08 MED ORDER — ASPIRIN 81 MG PO CHEW
81.0000 mg | CHEWABLE_TABLET | Freq: Every day | ORAL | Status: DC
  Administered 2016-03-09 – 2016-03-10 (×2): 81 mg via ORAL
  Filled 2016-03-08 (×2): qty 1

## 2016-03-08 MED ORDER — HEPARIN SODIUM (PORCINE) 1000 UNIT/ML IJ SOLN
INTRAMUSCULAR | Status: DC | PRN
  Administered 2016-03-08: 3000 [IU] via INTRAVENOUS

## 2016-03-08 MED ORDER — HEPARIN (PORCINE) IN NACL 2-0.9 UNIT/ML-% IJ SOLN
INTRAMUSCULAR | Status: AC
  Filled 2016-03-08: qty 1000

## 2016-03-08 MED ORDER — SODIUM CHLORIDE 0.9 % IV SOLN: 250.0000 mL | INTRAVENOUS | Status: DC | PRN

## 2016-03-08 MED ORDER — BISACODYL 5 MG PO TBEC: 10.0000 mg | DELAYED_RELEASE_TABLET | Freq: Every day | ORAL | Status: DC | PRN

## 2016-03-08 MED ORDER — ALBUTEROL SULFATE (2.5 MG/3ML) 0.083% IN NEBU
2.5000 mg | INHALATION_SOLUTION | Freq: Four times a day (QID) | RESPIRATORY_TRACT | Status: DC | PRN
Start: 2016-03-08 — End: 2016-03-10

## 2016-03-08 MED ORDER — SODIUM CHLORIDE 0.9 % IV SOLN
INTRAVENOUS | Status: DC | PRN
  Administered 2016-03-08: 10 mL/h via INTRAVENOUS

## 2016-03-08 MED ORDER — MORPHINE SULFATE (PF) 2 MG/ML IV SOLN
2.0000 mg | INTRAVENOUS | Status: DC | PRN
  Administered 2016-03-08 – 2016-03-10 (×6): 2 mg via INTRAVENOUS
  Filled 2016-03-08 (×6): qty 1

## 2016-03-08 MED ORDER — HEPARIN SODIUM (PORCINE) 5000 UNIT/ML IJ SOLN
4000.0000 [IU] | Freq: Once | INTRAMUSCULAR | Status: DC
  Filled 2016-03-08: qty 0.8

## 2016-03-08 MED ORDER — MORPHINE BOLUS VIA INFUSION
2.0000 mg | INTRAVENOUS | Status: DC | PRN
  Filled 2016-03-08: qty 4

## 2016-03-08 MED ORDER — VITAMIN D 1000 UNITS PO TABS
2000.0000 [IU] | ORAL_TABLET | Freq: Every day | ORAL | Status: DC
  Administered 2016-03-09: 2000 [IU] via ORAL
  Filled 2016-03-08: qty 2

## 2016-03-08 MED ORDER — CALCIUM CARBONATE-VITAMIN D 500-200 MG-UNIT PO TABS
1.0000 | ORAL_TABLET | Freq: Every day | ORAL | Status: DC
  Administered 2016-03-09 – 2016-03-10 (×2): 1 via ORAL
  Filled 2016-03-08 (×2): qty 1

## 2016-03-08 MED ORDER — SODIUM CHLORIDE 0.9% FLUSH
3.0000 mL | Freq: Two times a day (BID) | INTRAVENOUS | Status: DC
  Administered 2016-03-09 – 2016-03-10 (×2): 3 mL via INTRAVENOUS

## 2016-03-08 MED ORDER — HEPARIN (PORCINE) IN NACL 2-0.9 UNIT/ML-% IJ SOLN
INTRAMUSCULAR | Status: AC
  Filled 2016-03-08: qty 500

## 2016-03-08 MED ORDER — SODIUM CHLORIDE 0.9% FLUSH: 3.0000 mL | INTRAVENOUS | Status: DC | PRN

## 2016-03-08 MED ORDER — IOPAMIDOL (ISOVUE-370) INJECTION 76%
INTRAVENOUS | Status: DC | PRN
  Administered 2016-03-08: 55 mL via INTRAVENOUS

## 2016-03-08 MED ORDER — CALCIUM CITRATE-VITAMIN D 315-200 MG-UNIT PO TABS: 1.0000 | ORAL_TABLET | Freq: Every day | ORAL | Status: DC

## 2016-03-08 MED ORDER — ATORVASTATIN CALCIUM 40 MG PO TABS
40.0000 mg | ORAL_TABLET | Freq: Every day | ORAL | Status: DC
  Administered 2016-03-09: 40 mg via ORAL
  Filled 2016-03-08: qty 1

## 2016-03-08 MED ORDER — LEVOTHYROXINE SODIUM 25 MCG PO TABS
137.0000 ug | ORAL_TABLET | Freq: Every day | ORAL | Status: DC
  Administered 2016-03-09 – 2016-03-10 (×2): 137 ug via ORAL
  Filled 2016-03-08 (×2): qty 1

## 2016-03-08 MED ORDER — ONDANSETRON HCL 4 MG/2ML IJ SOLN
4.0000 mg | Freq: Four times a day (QID) | INTRAMUSCULAR | Status: DC | PRN
  Administered 2016-03-09 – 2016-03-10 (×3): 4 mg via INTRAVENOUS
  Filled 2016-03-08 (×3): qty 2

## 2016-03-08 MED ORDER — ADULT MULTIVITAMIN W/MINERALS CH
1.0000 | ORAL_TABLET | Freq: Every day | ORAL | Status: DC
  Administered 2016-03-09: 1 via ORAL
  Filled 2016-03-08: qty 1

## 2016-03-08 MED ORDER — ACETAMINOPHEN 325 MG PO TABS: 650.0000 mg | ORAL_TABLET | ORAL | Status: DC | PRN

## 2016-03-08 MED ORDER — VERAPAMIL HCL 2.5 MG/ML IV SOLN
INTRAVENOUS | Status: AC
  Filled 2016-03-08: qty 2

## 2016-03-08 MED ORDER — FUROSEMIDE 10 MG/ML IJ SOLN
40.0000 mg | Freq: Once | INTRAMUSCULAR | Status: AC
  Administered 2016-03-08: 40 mg via INTRAVENOUS
  Filled 2016-03-08: qty 4

## 2016-03-08 MED ORDER — APIXABAN 2.5 MG PO TABS
2.5000 mg | ORAL_TABLET | Freq: Two times a day (BID) | ORAL | Status: DC
  Administered 2016-03-09 – 2016-03-10 (×3): 2.5 mg via ORAL
  Filled 2016-03-08 (×3): qty 1

## 2016-03-08 MED ORDER — AMIODARONE HCL 200 MG PO TABS: 200.0000 mg | ORAL_TABLET | Freq: Every day | ORAL | Status: DC

## 2016-03-08 MED ORDER — IOPAMIDOL (ISOVUE-370) INJECTION 76%
INTRAVENOUS | Status: AC
  Filled 2016-03-08: qty 100

## 2016-03-08 SURGICAL SUPPLY — 13 items
CATH INFINITI 5 FR JL3.5 (CATHETERS) ×2 IMPLANT
CATH INFINITI 5 FR JR3.5 (CATHETERS) IMPLANT
CATH INFINITI 5FR ANG PIGTAIL (CATHETERS) ×2 IMPLANT
CATH OPTITORQUE JACKY 4.0 5F (CATHETERS) ×2 IMPLANT
DEVICE RAD COMP TR BAND LRG (VASCULAR PRODUCTS) ×2 IMPLANT
GLIDESHEATH SLEND SS 6F .021 (SHEATH) ×2 IMPLANT
KIT HEART LEFT (KITS) ×2 IMPLANT
PACK CARDIAC CATHETERIZATION (CUSTOM PROCEDURE TRAY) ×2 IMPLANT
TRANSDUCER W/STOPCOCK (MISCELLANEOUS) ×2 IMPLANT
TUBING CIL FLEX 10 FLL-RA (TUBING) ×2 IMPLANT
WIRE EMERALD ST .035X150CM (WIRE) ×2 IMPLANT
WIRE HI TORQ VERSACORE-J 145CM (WIRE) ×2 IMPLANT
WIRE SAFE-T 1.5MM-J .035X260CM (WIRE) ×2 IMPLANT

## 2016-03-08 NOTE — ED Notes (Signed)
Pt. Transferred to Cath lab accompanied by RN and EMT

## 2016-03-08 NOTE — H&P (Addendum)
History and Physical  Patient ID: Ann [name] MRN: [id number] DOB/AGE: 01-Jul-1933 70 y.o. Admit date: 03/08/2016  Primary Care Physician: Marlow Baars, MD Primary Cardiologist : Dr. Josiah Lobo  HPI:   This is an 82 year old female with past medical history of persistent atrial fibrillation, hyperlipidemia, hypertension, hypothyroidism and diabetes mellitus. She was hospitalized last month at [facility] with atrial fibrillation and questionable cardiac arrest. She was transferred to [facility]. She had issues with atrial fibrillation with rapid ventricular response and ultimately was placed on amiodarone which restored sinus rhythm. Her troponin was mildly elevated at 2.12 but she was felt to be not a good candidate for ischemic workup given age, comorbidities and chronic kidney disease. She had an echocardiogram done which showed normal LV systolic function, mild to moderate aortic regurgitation, mild aortic stenosis and moderate to severe tricuspid regurgitation. The patient was discharged to skilled nursing facility and recently went home. Over the last 2 days, she has not been able to do much physical activities due to excessive fatigue and having no energy. She started having substernal chest pain today which was severe and described as heaviness and tightness with radiation to both arms. This was associated with significant shortness of breath. She also has been having increased cough. When EMS arrived, she was mildly hypertensive. EKG showed 1 mm of ST elevation in V1 and V2 as well as aVR with global ST depression. Thus, the patient was transferred for emergent cardiac cath.  A 10 point review of system was performed. It is negative other than that mentioned in the history of present illness.   Past Medical History  Diagnosis Date  . Dyslipidemia   . Osteoporosis, unspecified   . Unspecified hypothyroidism   . Unspecified essential hypertension   . Type I (juvenile type)  diabetes mellitus without mention of complication, not stated as uncontrolled   . Atrial fibrillation (HCC)   . Allergic rhinitis, cause unspecified     Family History  Problem Relation Age of Onset  . Asthma      Social History   Social History  . Marital Status: Married    Spouse Name: N/A  . Number of Children: N/A  . Years of Education: N/A   Occupational History  . Not on file.   Social History Main Topics  . Smoking status: Never Smoker   . Smokeless tobacco: Not on file  . Alcohol Use: No  . Drug Use: No  . Sexual Activity: Not on file   Other Topics Concern  . Not on file   Social History Narrative    Past Surgical History  Procedure Laterality Date  . Vesicovaginal fistula closure w/ tah    . Nasal sinus surgery       Prescriptions prior to admission  Medication Sig Dispense Refill Last Dose  . albuterol (PROVENTIL HFA;VENTOLIN HFA) 108 (90 BASE) MCG/ACT inhaler Inhale 2 puffs into the lungs every 6 (six) hours as needed for wheezing. 1 Inhaler 5 Past Week at Unknown time  . amiodarone (PACERONE) 200 MG tablet Take  twice daily for 1 week and then take  once daily.     Marland Kitchen apixaban (ELIQUIS) 2.5 MG TABS tablet Take 1 tablet (2.5 mg total) by mouth 2 (two) times daily.     Marland Kitchen atorvastatin (LIPITOR) 40 MG tablet Take 1 tablet (40 mg total) by mouth daily.     . bisacodyl (DULCOLAX) 5 MG EC tablet Take 2 tablets (10 mg total) by mouth daily as  needed for moderate constipation. 30 tablet 0   . calcium citrate-vitamin D (CITRACAL+D) 315-200 MG-UNIT tablet Take 1 tablet by mouth daily.   02/08/2016 at am  . Cholecalciferol (VITAMIN D3) 2000 units capsule Take 1 capsule by mouth daily.   02/08/2016 at am  . docusate sodium (COLACE) 100 MG capsule Take 1 capsule (100 mg total) by mouth 2 (two) times daily. 10 capsule 0   . feeding supplement, GLUCERNA SHAKE, (GLUCERNA SHAKE) LIQD Take 237 mLs by mouth 2 (two) times daily between meals.  0   . fluticasone  (FLONASE) 50 MCG/ACT nasal spray Place 2 sprays into both nostrils at bedtime.   02/08/2016 at pm  . furosemide (LASIX) 40 MG tablet Take 1 tablet (40 mg total) by mouth daily. 30 tablet    . glucose blood test strip 1 each by Other route as needed. Use as instructed      . Insulin Detemir (LEVEMIR FLEXPEN) 100 UNIT/ML Pen Inject 18 Units into the skin daily at 10 pm. 15 mL 11   . insulin lispro (HUMALOG) 100 UNIT/ML injection Inject 2-6 Units into the skin 3 (three) times daily with meals. Sliding scale   02/08/2016 at am  . Insulin Pen Needle (PEN NEEDLES 31GX5/16") 31G X 8 MM MISC Use as directed     . levothyroxine (SYNTHROID, LEVOTHROID) 137 MCG tablet Take 137 mcg by mouth daily before breakfast.   02/06/2016 at AM  . metoprolol tartrate (LOPRESSOR) 25 MG tablet Take 1 tablet (25 mg total) by mouth 2 (two) times daily.     . Multiple Vitamin (MULTIVITAMIN) capsule Take 1 capsule by mouth daily.     02/08/2016 at am  . Polyethyl Glycol-Propyl Glycol 0.4-0.3 % SOLN Place 1-2 drops into both eyes daily as needed.    Past Week at Unknown time  . polyethylene glycol (MIRALAX / GLYCOLAX) packet Take 17 g by mouth daily. 14 each 0     Physical Exam: Blood pressure 115/63, pulse 65, temperature 98.5 F (36.9 C), temperature source Oral, resp. rate 18, SpO2 98 %.   Constitutional:  oriented to person, place, and time. He appears well-developed and well-nourished. No distress.  HENT: No nasal discharge.  Head: Normocephalic and atraumatic.  Eyes: Pupils are equal and round.  No discharge. Neck: Normal range of motion. Neck supple. No JVD present.  Cardiovascular: Normal rate, regular rhythm, normal heart sounds. Exam reveals no gallop and no friction rub. 2/6 crescendo decrescendo aortic stenosis murmur.  Pulmonary/Chest: Effort normal and breath sounds normal. No stridor. No respiratory distress.  no wheezes or rales.   Abdominal: Soft. Bowel sounds are normal. He exhibits no distension. There is no  tenderness. There is no rebound and no guarding.  Musculoskeletal: Normal range of motion. No edema and no tenderness.  Neurological: Alert and oriented to person, place, and time. Coordination normal.  Skin: Skin is warm and dry. No rash noted. He is not diaphoretic. No erythema. No pallor.  Psychiatric: Normal mood and affect.  behavior is normal. Judgment and thought content normal.    Labs:   Lab Results  Component Value Date   WBC 8.3 03/08/2016   HGB 10.9* 03/08/2016   HGB 11.6* 03/08/2016   HCT 34.1* 03/08/2016   HCT 34.0* 03/08/2016   MCV 98.3 03/08/2016   PLT 200 03/08/2016    Recent Labs Lab 03/08/16 1905  NA 135  136  K 3.7  3.8  CL 99*  98*  CO2 23  BUN 71*  71*  CREATININE 2.65*  2.70*  CALCIUM 9.2  PROT 6.2*  BILITOT 0.8  ALKPHOS 63  ALT 38  AST 41  GLUCOSE 241*  235*   Lab Results  Component Value Date   CKTOTAL 47 03/08/2016   CKMB 3.8 03/08/2016   TROPONINI 0.29* 03/08/2016      Radiology: Ct Head Wo Contrast  02/14/2016  CLINICAL DATA:  Altered mental status EXAM: CT HEAD WITHOUT CONTRAST TECHNIQUE: Contiguous axial images were obtained from the base of the skull through the vertex without intravenous contrast. COMPARISON:  None. FINDINGS: No skull fracture is noted. Paranasal sinuses and mastoid air cells are unremarkable. No intracranial hemorrhage, mass effect or midline shift. No acute cortical infarction. No mass lesion is noted on this unenhanced scan. Mild cerebral atrophy. Mild periventricular and patchy subcortical white matter decreased attenuation probable due to chronic small vessel ischemic changes. Mild atherosclerotic calcifications of carotid siphon. No intra or extra-axial fluid collection. IMPRESSION: No acute intracranial abnormality. Mild cerebral atrophy. Periventricular and patchy subcortical white matter decreased attenuation probable due to chronic small vessel ischemic changes. No definite acute cortical infarction.  Electronically Signed   By: Natasha MeadLiviu  Pop M.D.   On: 02/14/2016 13:23   Portable Chest 1 View  02/13/2016  CLINICAL DATA:  Atrial fibrillation with rapid ventricular response EXAM: PORTABLE CHEST 1 VIEW COMPARISON:  Portable exam 2032 hours without priors for comparison. FINDINGS: Borderline enlargement of cardiac silhouette. Probable mitral annular calcification. Atherosclerotic calcification aorta. Pulmonary vascularity normal. Bronchitic changes without pulmonary infiltrate, pleural effusion or pneumothorax. Bones unremarkable. IMPRESSION: Borderline enlargement of cardiac silhouette. Bronchitic changes without infiltrate. Electronically Signed   By: Ulyses SouthwardMark  Boles M.D.   On: 02/13/2016 20:46    EKG: Independently reviewed by me and showed  sinus bradycardia with 1 mm of ST elevation in V1, V2 and aVR with ST depression everywhere else.  ASSESSMENT AND PLAN:   1. ST elevation myocardial infarction: The EKG changes were worrisome for left main involvement. Emergent cardiac catheterization was done which showed critical left main, significant proximal LAD and ostial RCA disease with severe mitral regurgitation, moderately to severely reduced LV systolic function and at least moderate mitral stenosis. Dr. Donata ClayVan Trigt was consulted and we both evaluated the patient and felt that she was not a surgical candidate. There was also no good options for endovascular intervention.  Overall prognosis is poor. Continue medical therapy. Hospice care is appropriate.  2. Persistent atrial fibrillation: Maintaining in sinus rhythm. Continue amiodarone. Resume Eliquis tomorrow. I'm going to give heparin drip overnight. Metoprolol was held due to bradycardia.  3. Acute on chronic renal failure: stage 4 .  LVEDP was moderately elevated which limits hydration. Her renal function might worsen but she is not a good candidate for dialysis.  Signed: Lorine BearsMuhammad Charleigh Correnti MD, [facility] 03/08/2016, 8:19 PM

## 2016-03-08 NOTE — Progress Notes (Signed)
Dr. Donnie Ahoilley made aware that patient has increased work of breathing, increased oxygen demand, wheezing and coarse sounding lungs. Also made aware chest pain is 6/10.  Order received for a dose of IV lasix and to give another dose of Morphine. Will continue to closely monitor. Thresa RossA. Haggard RN

## 2016-03-08 NOTE — ED Provider Notes (Signed)
CSN: [id number]     Arrival date & time 03/08/16  1805 History   First MD Initiated Contact with Patient 03/08/16 1810     Chief Complaint  Patient presents with  . Code STEMI   Patient is a 82 y.o. female presenting with chest pain.  Chest Pain Pain location:  L chest Pain radiates to:  L shoulder Pain radiates to the back: no   Pain severity:  Moderate Onset quality:  Gradual Timing:  Constant Progression:  Resolved Chronicity:  New Context: at rest   Context: not breathing   Relieved by:  Aspirin and antacids Worsened by:  Exertion Associated symptoms: diaphoresis and shortness of breath   Associated symptoms: no abdominal pain, no altered mental status, no anxiety, no back pain, no cough and no near-syncope   Risk factors: obesity   Risk factors: no coronary artery disease, no immobilization and no prior DVT/PE    Pt. developed lt. Chest pain into her lt. Shoulder. approximatley 1 hour ago,. Upon EMS arrival pt. Was pale , cool and diphoretic. Pt. Took 2 tums and the EMS gave pt.  ASA.   Past Medical History  Diagnosis Date  . Dyslipidemia   . Osteoporosis, unspecified   . Unspecified hypothyroidism   . Unspecified essential hypertension   . Type I (juvenile type) diabetes mellitus without mention of complication, not stated as uncontrolled   . Atrial fibrillation (HCC)   . Allergic rhinitis, cause unspecified    Past Surgical History  Procedure Laterality Date  . Vesicovaginal fistula closure w/ tah    . Nasal sinus surgery     Family History  Problem Relation Age of Onset  . Asthma     Social History  Substance Use Topics  . Smoking status: Never Smoker   . Smokeless tobacco: None  . Alcohol Use: No   OB History    No data available     Review of Systems  Constitutional: Positive for diaphoresis.  Respiratory: Positive for shortness of breath. Negative for cough.   Cardiovascular: Positive for chest pain. Negative for near-syncope.   Gastrointestinal: Negative for abdominal pain.  Musculoskeletal: Negative for back pain.  All other systems reviewed and are negative.  Allergies  Penicillins  Home Medications   Prior to Admission medications   Medication Sig Start Date End Date Taking? Authorizing Provider  albuterol (PROVENTIL HFA;VENTOLIN HFA) 108 (90 BASE) MCG/ACT inhaler Inhale 2 puffs into the lungs every 6 (six) hours as needed for wheezing. 01/13/12 02/15/16  Waymon Budge, MD  amiodarone (PACERONE) 200 MG tablet Take  twice daily for 1 week and then take  once daily. 02/18/16   Osvaldo Shipper, MD  apixaban (ELIQUIS) 2.5 MG TABS tablet Take 1 tablet (2.5 mg total) by mouth 2 (two) times daily. 02/18/16   Osvaldo Shipper, MD  atorvastatin (LIPITOR) 40 MG tablet Take 1 tablet (40 mg total) by mouth daily. 02/18/16   Osvaldo Shipper, MD  bisacodyl (DULCOLAX) 5 MG EC tablet Take 2 tablets (10 mg total) by mouth daily as needed for moderate constipation. 02/18/16   Osvaldo Shipper, MD  calcium citrate-vitamin D (CITRACAL+D) 315-200 MG-UNIT tablet Take 1 tablet by mouth daily.    Historical Provider, MD  Cholecalciferol (VITAMIN D3) 2000 units capsule Take 1 capsule by mouth daily.    Historical Provider, MD  docusate sodium (COLACE) 100 MG capsule Take 1 capsule (100 mg total) by mouth 2 (two) times daily. 02/18/16   Osvaldo Shipper, MD  feeding supplement, GLUCERNA  SHAKE, (GLUCERNA SHAKE) LIQD Take 237 mLs by mouth 2 (two) times daily between meals. 02/18/16   Osvaldo Shipper, MD  fluticasone (FLONASE) 50 MCG/ACT nasal spray Place 2 sprays into both nostrils at bedtime.    Historical Provider, MD  furosemide (LASIX) 40 MG tablet Take 1 tablet (40 mg total) by mouth daily. 02/18/16   Osvaldo Shipper, MD  glucose blood test strip 1 each by Other route as needed. Use as instructed     Historical Provider, MD  Insulin Detemir (LEVEMIR FLEXPEN) 100 UNIT/ML Pen Inject 18 Units into the skin daily at 10 pm. 02/18/16   Osvaldo Shipper, MD  insulin lispro (HUMALOG) 100 UNIT/ML injection Inject 2-6 Units into the skin 3 (three) times daily with meals. Sliding scale    Historical Provider, MD  Insulin Pen Needle (PEN NEEDLES 31GX5/16") 31G X 8 MM MISC Use as directed 10/23/13   Historical Provider, MD  levothyroxine (SYNTHROID, LEVOTHROID) 137 MCG tablet Take 137 mcg by mouth daily before breakfast.    Historical Provider, MD  metoprolol tartrate (LOPRESSOR) 25 MG tablet Take 1 tablet (25 mg total) by mouth 2 (two) times daily. 02/18/16   Osvaldo Shipper, MD  Multiple Vitamin (MULTIVITAMIN) capsule Take 1 capsule by mouth daily.      Historical Provider, MD  Polyethyl Glycol-Propyl Glycol 0.4-0.3 % SOLN Place 1-2 drops into both eyes daily as needed.     Historical Provider, MD  polyethylene glycol (MIRALAX / GLYCOLAX) packet Take 17 g by mouth daily. 02/18/16   Osvaldo Shipper, MD   BP 99/60 mmHg  Pulse 53  Temp(Src) 98 F (36.7 C) (Oral)  Resp 21  SpO2 100% Physical Exam  Constitutional: She is oriented to person, place, and time. She appears well-developed and well-nourished. No distress.  HENT:  Head: Normocephalic and atraumatic.  Eyes: Conjunctivae are normal. Right eye exhibits no discharge. Left eye exhibits no discharge.  Neck: Normal range of motion. Neck supple.  Cardiovascular: Normal rate, regular rhythm, normal heart sounds and intact distal pulses.   Pulmonary/Chest: Effort normal and breath sounds normal. No respiratory distress. She exhibits no tenderness.  On 2L [place]  Abdominal: Soft. Bowel sounds are normal. She exhibits no distension and no mass. There is no tenderness. There is no rebound and no guarding.  Musculoskeletal: She exhibits no edema (no asymmetric edema) or tenderness (of LEs).  Neurological: She is alert and oriented to person, place, and time.  Skin: Skin is warm. No rash noted.  Psychiatric: She has a normal mood and affect.  Nursing note and vitals reviewed.  ED Course   Procedures (including critical care time) Labs Review Labs Reviewed  CBC WITH DIFFERENTIAL/PLATELET - Abnormal; Notable for the following:    RBC 3.47 (*)    Hemoglobin 10.9 (*)    HCT 34.1 (*)    RDW 16.0 (*)    All other components within normal limits  COMPREHENSIVE METABOLIC PANEL - Abnormal; Notable for the following:    Chloride 99 (*)    Glucose, Bld 241 (*)    BUN 71 (*)    Creatinine, Ser 2.65 (*)    Total Protein 6.2 (*)    Albumin 2.8 (*)    GFR calc non Af Amer 16 (*)    GFR calc Af Amer 18 (*)    All other components within normal limits  PROTIME-INR - Abnormal; Notable for the following:    Prothrombin Time 22.2 (*)    INR 1.96 (*)    All  other components within normal limits  APTT - Abnormal; Notable for the following:    aPTT >200 (*)    All other components within normal limits  TROPONIN I - Abnormal; Notable for the following:    Troponin I 0.29 (*)    All other components within normal limits  GLUCOSE, CAPILLARY - Abnormal; Notable for the following:    Glucose-Capillary 191 (*)    All other components within normal limits  GLUCOSE, CAPILLARY - Abnormal; Notable for the following:    Glucose-Capillary 182 (*)    All other components within normal limits  POCT I-STAT, CHEM 8 - Abnormal; Notable for the following:    Chloride 98 (*)    BUN 71 (*)    Creatinine, Ser 2.70 (*)    Glucose, Bld 235 (*)    Hemoglobin 11.6 (*)    HCT 34.0 (*)    All other components within normal limits  CK TOTAL AND CKMB (NOT AT [facility])  LIPID PANEL  HEMOGLOBIN A1C  I-STAT CHEM 8, ED    Imaging Review No results found. I have personally reviewed and evaluated these images and lab results as part of my medical decision-making.   EKG Interpretation   Date/Time:  Sunday March 08 2016 18:14:29 EDT Ventricular Rate:  55 PR Interval:  210 QRS Duration: 125 QT Interval:  508 QTC Calculation: 486 R Axis:   -78 Text Interpretation:  Sinus rhythm IVCD, consider atypical  RBBB  Anteroseptal infarct, age indeterminate ST elevation v1, v2, avR ST  depression inferior laterally Confirmed by RANCOUR  MD, STEPHEN [phone]([id number]) on  03/08/2016 6:34:18 PM      MDM   Final diagnoses:  ST elevation myocardial infarction (STEMI), unspecified artery (HCC)    STEMI evident on EKG with reciprocal changes. Cardiology notified and taken to Cath Lab emergently. Patient hemodynamically stable and airway intact and stable on nasal cannula. Doubt dissection. Given emergent condition and how quickly patient could be transported to the Cath Lab, heparin was not initiated. Status post aspirin. Patient chest pain free and given soft blood pressures, no nitroglycerin administered. Patient transferred to Cath Lab with no acute events.    Sidney AceAlison Charruf Less Woolsey, MD 03/09/16 0139  Glynn OctaveStephen Rancour, MD 03/09/16 [phone]

## 2016-03-08 NOTE — Progress Notes (Signed)
   03/08/16 1800  Clinical Encounter Type  Visited With Family  Visit Type ED  Referral From Nurse  Consult/Referral To Chaplain  Stress Factors  Patient Stress Factors Health changes  CHP responded to STEMI. CHP met family in waiting room and escorted to Trauma C. Patient taken to Cath Lab. Family waiting in Cath Lab. Waiting area; daugther-in-law is [employer] employee.  CHP is available as needed. Roe Coombs 03/08/2016

## 2016-03-08 NOTE — ED Notes (Signed)
Pt. Developed lt. Chest pain into her lt. Shoulder. approximatley 1 hour ago,.  Upon EMS arrival pt. Was pale , cool and diphoretic.   Pt. Took 2 tums and the EMS gave pt. 324mg  ASA. Upon arrival to ED she is pain free

## 2016-03-08 NOTE — Progress Notes (Signed)
Day of Surgery Procedure(s) (LRB): Left Heart Cath and Coronary Angiography (N/A) Subjective: Patient examined, coronary angiograms reviewed with Dr. Kirke CorinArida In cath lab, situation reviewed with patient as well as the patient's family.  82 year old diabetic with chronic renal failure BUN 70 creatinine 2.8 and recent hospitalization for V. fib arrest presented to the hospital with chest pain and EKG changes. She is found to have severe three-vessel coronary disease with highly calcified 99% left main stenosis with distal LAD calcified stenosis ostial RCA stenosis and moderate LV dysfunction with moderate to severe MR, calcified aortic valve with moderate to severe AS and known moderate to severe TR from echo 2 weeks ago. Currently stable blood pressure and pain free  Objective: Vital signs in last 24 hours: Temp:  [98.5 F (36.9 C)] 98.5 F (36.9 C) (06/11 1818) Pulse Rate:  [65] 65 (06/11 1818) Cardiac Rhythm:  [-]  Resp:  [18] 18 (06/11 1818) BP: (115)/(63) 115/63 mmHg (06/11 1818) SpO2:  [98 %] 98 % (06/11 1836)  Hemodynamic parameters for last 24 hours:  sinus rhythm  Intake/Output from previous day:   Intake/Output this shift:    Patient under covers on cath lab table but is responsive and appropriate although hard of hearing  Lab Results:  Recent Labs  03/08/16 1905  WBC 8.3  HGB 10.9*  11.6*  HCT 34.1*  34.0*  PLT 200   BMET:  Recent Labs  03/08/16 1905  NA 135  136  K 3.7  3.8  CL 99*  98*  CO2 23  GLUCOSE 241*  235*  BUN 71*  71*  CREATININE 2.65*  2.70*  CALCIUM 9.2    PT/INR:  Recent Labs  03/08/16 1905  LABPROT 22.2*  INR 1.96*   ABG    Component Value Date/Time   TCO2 23 03/08/2016 1905   CBG (last 3)  No results for input(s): GLUCAP in the last 72 hours.  Assessment/Plan: S/P Procedure(s) (LRB): Left Heart Cath and Coronary Angiography (N/A) Patient would have no realistic chance of surviving or regaining meaningful quality of  life after multivessel bypass grafting with simultaneous multivalve repair-replacement surgery. She would most likely develop acute on chronic renal failure and never survive the hospitalization. The patient has been evaluated for possible PCI of her left main as some palliation however this is not possible according to her cardiologist.. Medical therapy for her CAD and valvular disease and pulmonary hypertension appears to be the best option although with poor prognosis. The patient's husband understands and agrees as well as the patient herself.     Kathlee Nationseter Van Trigt III 03/08/2016

## 2016-03-09 ENCOUNTER — Encounter (HOSPITAL_COMMUNITY): Payer: Self-pay | Admitting: Cardiovascular Disease

## 2016-03-09 LAB — GLUCOSE, CAPILLARY
GLUCOSE-CAPILLARY: 196 mg/dL — AB (ref 65–99)
GLUCOSE-CAPILLARY: 223 mg/dL — AB (ref 65–99)
Glucose-Capillary: 182 mg/dL — ABNORMAL HIGH (ref 65–99)
Glucose-Capillary: 156 mg/dL — ABNORMAL HIGH (ref 65–99)
Glucose-Capillary: 142 mg/dL — ABNORMAL HIGH (ref 65–99)
Glucose-Capillary: 117 mg/dL — ABNORMAL HIGH (ref 65–99)

## 2016-03-09 LAB — HEMOGLOBIN A1C
HEMOGLOBIN A1C: 7.9 % — AB (ref 4.8–5.6)
Mean Plasma Glucose: 180 mg/dL

## 2016-03-09 MED ORDER — INSULIN ASPART 100 UNIT/ML ~~LOC~~ SOLN
0.0000 [IU] | SUBCUTANEOUS | Status: DC
  Administered 2016-03-09: 3 [IU] via SUBCUTANEOUS
  Administered 2016-03-09: 4 [IU] via SUBCUTANEOUS
  Administered 2016-03-09: 7 [IU] via SUBCUTANEOUS
  Administered 2016-03-09: 4 [IU] via SUBCUTANEOUS
  Administered 2016-03-09: 3 [IU] via SUBCUTANEOUS
  Administered 2016-03-10 (×2): 7 [IU] via SUBCUTANEOUS
  Administered 2016-03-10: 3 [IU] via SUBCUTANEOUS
  Administered 2016-03-10: 4 [IU] via SUBCUTANEOUS

## 2016-03-09 MED FILL — Heparin Sodium (Porcine) 2 Unit/ML in Sodium Chloride 0.9%: INTRAMUSCULAR | Qty: 1000 | Status: AC

## 2016-03-09 NOTE — Care Management Note (Signed)
Case Management Note  Patient Details  Name: Ann [name] MRN: [id number] Date of Birth: 26-Oct-1932  Subjective/Objective:  CM received call from RN re family's request after receiving information about pt's prognosis.  Met with pt and husband, Ann [name] [phone]), @ bedside.  They are requesting transfer to [facility] [place] notified.                       Expected Discharge Plan:  [employer]  In-House Referral:  Clinical Social Work  Discharge planning Services  CM Consult  Status of Service:  In process, will continue to follow  Girard Cooter, RN 03/09/2016, 4:14 PM

## 2016-03-09 NOTE — Progress Notes (Signed)
Initial Nutrition Assessment  DOCUMENTATION CODES:   Not applicable  INTERVENTION:  Glucerna Shake BID. Each supplement provides 220 kcals and 10 grams of protein.   NUTRITION DIAGNOSIS:   Inadequate oral intake related to poor appetite, vomiting, nausea as evidenced by per patient/family report.  GOAL:   Patient will meet greater than or equal to 90% of their needs  MONITOR:   PO intake, Supplement acceptance, Labs, Weight trends, Skin, I & O's  REASON FOR ASSESSMENT:   Malnutrition Screening Tool    ASSESSMENT:   Pt with past medical history of persistent atrial fibrillation, hyperlipidemia, hypertension, hypothyroidism and diabetes mellitus.Over the last 2 days, she has not been able to do much physical activities due to excessive fatigue and having no energy. She started having substernal chest pain today which was severe and described as heaviness and tightness with radiation to both arms. This was associated with significant shortness of breath. She also has been having increased cough.  PT HOH. Pt gave minimal feedback. Pt has a poor appetite and is experiencing N/V.  Spoke with RN who reports pt did not eat breakfast. Pt amenable to receiving Glucerna shake but is not eating/drinking anything right now d/t nausea.  Per MD, not a surgical candidate and poor prognosis.  Will continue to provide Glucerna shake.   Weight: pt does not know UBW and could not give weight history. No recent weight hx in chart. Pt currently at 162 lbs and  NFPE: no muscle or fat depletion, mild edema.  Labs reviewed; CBGs 117-377, Cl 99, BUN 71, creat 2.7, GFR 16. Meds reviewed; Ca-vit D, Vitamin D, Lasix, MVI w/ minerals.  Diet Order:  Diet heart healthy/carb modified Room service appropriate?: Yes; Fluid consistency:: Thin  Skin:  Wound (see comment) (Stg II PU buttocks)  Last BM:  Unknown  Height:   Ht Readings from Last 1 Encounters:  02/13/16 5\' 6"  (1.676 m)    Weight:    Wt Readings from Last 1 Encounters:  02/18/16 162 lb 11.2 oz (73.8 kg)    Ideal Body Weight:  59.1 kg  BMI:  25.8 kg/m^2 Estimated Nutritional Needs:   Kcal:  1600-1800 kcals   Protein:  80-90 g  Fluid:  1.6-1.8 L  EDUCATION NEEDS:   No education needs identified at this time  Beryle QuantMeredith Acea Yagi, MS [facility] Dietetic Intern Pager [phone]

## 2016-03-09 NOTE — Progress Notes (Signed)
[facility] responded to call. The patient was in a light sleep but would occasionally awake and was coherent. Provided ministry of presence and listening as the husband recalled their 50+ years of marriage, expressed their strong faith values, and their general feelings of peace in this circumstance. I offered prayer for continued peace as they enter into this final stage with hospice.  Ann SkeensBenjamin T Christian [name] 4:41 PM    03/09/16 1600  Clinical Encounter Type  Visited With Patient and family together  Visit Type Initial;Spiritual support;Other (Comment) (Hospice Transfer )  Referral From Nurse  Spiritual Encounters  Spiritual Needs Prayer;Emotional

## 2016-03-09 NOTE — Progress Notes (Signed)
Patient Name: Ann QuarryMolly H Mills Date of Encounter: 03/09/2016   SUBJECTIVE  No chest pain or SOB. No complains.   CURRENT MEDS . amiodarone  200 mg Oral Daily  . apixaban  2.5 mg Oral BID  . aspirin  81 mg Oral Daily  . atorvastatin  40 mg Oral Daily  . calcium-vitamin D  1 tablet Oral Q breakfast  . cholecalciferol  2,000 Units Oral Daily  . docusate sodium  100 mg Oral BID  . feeding supplement (GLUCERNA SHAKE)  237 mL Oral BID BM  . fluticasone  2 spray Each Nare QHS  . furosemide  40 mg Oral Daily  . heparin  4,000 Units Intravenous Once  . insulin aspart  0-20 Units Subcutaneous Q4H  . levothyroxine  137 mcg Oral QAC breakfast  . multivitamin with minerals  1 tablet Oral Daily  . sodium chloride flush  3 mL Intravenous Q12H    OBJECTIVE  Filed Vitals:   03/09/16 1030 03/09/16 1041 03/09/16 1045 03/09/16 1100  BP: 80/51 82/44 90/50  87/49  Pulse: 46 54 50 49  Temp:      TempSrc:      Resp: 15 17 19 22   SpO2: 100% 100% 100% 100%    Intake/Output Summary (Last 24 hours) at 03/09/16 1126 Last data filed at 03/09/16 1100  Gross per 24 hour  Intake    280 ml  Output    300 ml  Net    -20 ml   There were no vitals filed for this visit.  PHYSICAL EXAM  General: Pleasant, NAD. Neuro: Alert and oriented X 3. Moves all extremities spontaneously. Psych: Normal affect. HEENT:  Normal  Neck: Supple without bruits or JVD. Lungs:  Resp regular and unlabored, CTA. Heart: RRR no s3, s4, or murmurs. Abdomen: Soft, non-tender, non-distended, BS + x 4.  Extremities: No clubbing, cyanosis or edema. DP/PT/Radials 2+ and equal bilaterally.  Accessory Clinical Findings  CBC  Recent Labs  03/08/16 1905  WBC 8.3  NEUTROABS 6.6  HGB 10.9*  11.6*  HCT 34.1*  34.0*  MCV 98.3  PLT 200   Basic Metabolic Panel  Recent Labs  03/08/16 1905  NA 135  136  K 3.7  3.8  CL 99*  98*  CO2 23  GLUCOSE 241*  235*  BUN 71*  71*  CREATININE 2.65*  2.70*  CALCIUM 9.2    Liver Function Tests  Recent Labs  03/08/16 1905  AST 41  ALT 38  ALKPHOS 63  BILITOT 0.8  PROT 6.2*  ALBUMIN 2.8*   No results for input(s): LIPASE, AMYLASE in the last 72 hours. Cardiac Enzymes  Recent Labs  03/08/16 1905  CKTOTAL 47  CKMB 3.8  TROPONINI 0.29*   BNP Invalid input(s): POCBNP D-Dimer No results for input(s): DDIMER in the last 72 hours. Hemoglobin A1C  Recent Labs  03/08/16 1905  HGBA1C 7.9*   Fasting Lipid Panel  Recent Labs  03/08/16 1905  CHOL 168  HDL 49  LDLCALC 90  TRIG 146  CHOLHDL 3.4   Thyroid Function Tests No results for input(s): TSH, T4TOTAL, T3FREE, THYROIDAB in the last 72 hours.  Invalid input(s): FREET3  TELE  Sinus rhythm at rate of 50s  Radiology/Studies  Ct Head Wo Contrast  02/14/2016  CLINICAL DATA:  Altered mental status EXAM: CT HEAD WITHOUT CONTRAST TECHNIQUE: Contiguous axial images were obtained from the base of the skull through the vertex without intravenous contrast. COMPARISON:  None. FINDINGS: No skull fracture  is noted. Paranasal sinuses and mastoid air cells are unremarkable. No intracranial hemorrhage, mass effect or midline shift. No acute cortical infarction. No mass lesion is noted on this unenhanced scan. Mild cerebral atrophy. Mild periventricular and patchy subcortical white matter decreased attenuation probable due to chronic small vessel ischemic changes. Mild atherosclerotic calcifications of carotid siphon. No intra or extra-axial fluid collection. IMPRESSION: No acute intracranial abnormality. Mild cerebral atrophy. Periventricular and patchy subcortical white matter decreased attenuation probable due to chronic small vessel ischemic changes. No definite acute cortical infarction. Electronically Signed   By: Natasha Mead M.D.   On: 02/14/2016 13:23   Portable Chest 1 View  02/13/2016  CLINICAL DATA:  Atrial fibrillation with rapid ventricular response EXAM: PORTABLE CHEST 1 VIEW COMPARISON:   Portable exam 2032 hours without priors for comparison. FINDINGS: Borderline enlargement of cardiac silhouette. Probable mitral annular calcification. Atherosclerotic calcification aorta. Pulmonary vascularity normal. Bronchitic changes without pulmonary infiltrate, pleural effusion or pneumothorax. Bones unremarkable. IMPRESSION: Borderline enlargement of cardiac silhouette. Bronchitic changes without infiltrate. Electronically Signed   By: Ulyses Southward M.D.   On: 02/13/2016 20:46    ASSESSMENT AND PLAN  1. STEMI - Emergent cath showed  critical left main, significant proximal LAD and ostial RCA disease with severe mitral regurgitation, moderately to severely reduced LV systolic function and at least moderate mitral stenosis. Not a candidate for surgery or PCI. Continue medical therapy. Overall poor prognosis. Dr. Kirke Corin has discussed possible hospice care and husband at bedside agrees. Will request consult.   2. Persistent atrial fibrillation - Maintaining sinus rhythm. Continue amiodarone and Eliquis.   3. Acute on chronic renal failure - No labs were done today. Poor candidate for dialysis.  4. Hypotension  - critical situation. Had increased work of breathing last night, now on lasix  qd. On gentle fluid. Will get hospice involve to discuss goals of care.    Lorelei Pont PA-C Pager [phone]  Attending Note:   The patient was seen and examined.  Agree with assessment and plan as noted above.  Changes made to the above note as needed.  Patient seen and independently examined with Vin bhagat, PA.   We discussed all aspects of the encounter. I agree with the assessment and plan as stated above.  Pt has critical LM disease and is not a candiate for CABG.   Anatomy is not suitable for PCI.   I've talked with husband, her son, pastor and pastor's wife. Answered questions.  Agree that Hospice consult is our best option.   There are no options for revascularization.      Vesta Mixer, Montez Hageman., MD, [facility] 03/09/2016, 1:24 PM [address],  [address] Office [phone] Pager [phone]

## 2016-03-09 NOTE — Progress Notes (Signed)
CT surgery  Patient resting comfortably BP slowly dropping Moprhine given once for chest pressure Spoke with Husband at bedside

## 2016-03-10 DIAGNOSIS — R0600 Dyspnea, unspecified: Secondary | ICD-10-CM | POA: Insufficient documentation

## 2016-03-10 DIAGNOSIS — Z515 Encounter for palliative care: Secondary | ICD-10-CM | POA: Insufficient documentation

## 2016-03-10 LAB — GLUCOSE, CAPILLARY
GLUCOSE-CAPILLARY: 145 mg/dL — AB (ref 65–99)
GLUCOSE-CAPILLARY: 232 mg/dL — AB (ref 65–99)
Glucose-Capillary: 218 mg/dL — ABNORMAL HIGH (ref 65–99)
Glucose-Capillary: 178 mg/dL — ABNORMAL HIGH (ref 65–99)

## 2016-03-10 MED ORDER — HALOPERIDOL 0.5 MG PO TABS: 0.5000 mg | ORAL_TABLET | ORAL | Status: DC | PRN

## 2016-03-10 MED ORDER — POLYVINYL ALCOHOL 1.4 % OP SOLN
1.0000 [drp] | Freq: Four times a day (QID) | OPHTHALMIC | Status: DC | PRN
  Filled 2016-03-10: qty 15

## 2016-03-10 MED ORDER — GLYCOPYRROLATE 1 MG PO TABS: 1.0000 mg | ORAL_TABLET | ORAL | Status: DC | PRN

## 2016-03-10 MED ORDER — OXYCODONE HCL 5 MG PO TABS
2.5000 mg | ORAL_TABLET | Freq: Three times a day (TID) | ORAL | Status: DC
  Administered 2016-03-10: 2.5 mg via ORAL
  Filled 2016-03-10: qty 1

## 2016-03-10 MED ORDER — SODIUM CHLORIDE 0.9 % IV SOLN
250.0000 mL | INTRAVENOUS | Status: DC | PRN
Start: 2016-03-10 — End: 2016-03-10

## 2016-03-10 MED ORDER — MORPHINE SULFATE (PF) 2 MG/ML IV SOLN: 1.0000 mg | INTRAVENOUS | Status: DC | PRN

## 2016-03-10 MED ORDER — SIMETHICONE 80 MG PO CHEW: 160.0000 mg | CHEWABLE_TABLET | Freq: Four times a day (QID) | ORAL | Status: DC | PRN

## 2016-03-10 MED ORDER — BIOTENE DRY MOUTH MT LIQD: 15.0000 mL | OROMUCOSAL | Status: DC | PRN

## 2016-03-10 MED ORDER — HALOPERIDOL LACTATE 2 MG/ML PO CONC
0.5000 mg | ORAL | Status: DC | PRN
  Filled 2016-03-10: qty 0.3

## 2016-03-10 MED ORDER — GLYCOPYRROLATE 0.2 MG/ML IJ SOLN
0.2000 mg | INTRAMUSCULAR | Status: DC | PRN
  Filled 2016-03-10: qty 1

## 2016-03-10 MED ORDER — ACETAMINOPHEN 650 MG RE SUPP: 650.0000 mg | Freq: Four times a day (QID) | RECTAL | Status: DC | PRN

## 2016-03-10 MED ORDER — SODIUM CHLORIDE 0.9% FLUSH: 3.0000 mL | Freq: Two times a day (BID) | INTRAVENOUS | Status: DC

## 2016-03-10 MED ORDER — ACETAMINOPHEN 325 MG PO TABS: 650.0000 mg | ORAL_TABLET | Freq: Four times a day (QID) | ORAL | Status: DC | PRN

## 2016-03-10 MED ORDER — HALOPERIDOL LACTATE 5 MG/ML IJ SOLN: 0.5000 mg | INTRAMUSCULAR | Status: DC | PRN

## 2016-03-10 MED ORDER — SODIUM CHLORIDE 0.9% FLUSH: 3.0000 mL | INTRAVENOUS | Status: DC | PRN

## 2016-03-10 NOTE — Clinical Social Work Note (Signed)
Per MD patient is ready to [facility].  Patient's husband and facility notified of discharge. RN given phone number for report and transport packet is on patient's chart. Ambulance transport requested. CSW signing off.   Valero EnergyShonny Daquane Aguilar, LCSW [phone]

## 2016-03-10 NOTE — Progress Notes (Addendum)
Patient Name: Ann [name] Date of Encounter: 03/10/2016   SUBJECTIVE  No chest pain or SOB. No complains.  Is being seen by the Hospice nurse.   CURRENT MEDS . amiodarone  200 mg Oral Daily  . aspirin  81 mg Oral Daily  . docusate sodium  100 mg Oral BID  . feeding supplement (GLUCERNA SHAKE)  237 mL Oral BID BM  . fluticasone  2 spray Each Nare QHS  . furosemide  40 mg Oral Daily  . insulin aspart  0-20 Units Subcutaneous Q4H  . oxyCODONE  2.5 mg Oral Q8H  . sodium chloride flush  3 mL Intravenous Q12H  . sodium chloride flush  3 mL Intravenous Q12H    OBJECTIVE  Filed Vitals:   03/10/16 0826 03/10/16 0900 03/10/16 1000 03/10/16 1100  BP:  82/43    Pulse:  43 42 45  Temp: 97.2 F (36.2 C)     TempSrc: Oral     Resp:  15 17 20   SpO2:  100% 100% 100%    Intake/Output Summary (Last 24 hours) at 03/10/16 1151 Last data filed at 03/10/16 0900  Gross per 24 hour  Intake    750 ml  Output    550 ml  Net    200 ml   There were no vitals filed for this visit.  PHYSICAL EXAM  General: Pleasant, NAD. Neuro: Alert and oriented X 3. Moves all extremities spontaneously. Psych: Normal affect. HEENT:  Normal  Neck: Supple without bruits or JVD. Lungs:  Resp regular and unlabored, CTA. Heart: RRR no s3, s4, or murmurs. Abdomen: Soft, non-tender, non-distended, BS + x 4.  Extremities: No clubbing, cyanosis or edema. DP/PT/Radials 2+ and equal bilaterally.  Accessory Clinical Findings  CBC  Recent Labs  03/08/16 1905  WBC 8.3  NEUTROABS 6.6  HGB 10.9*  11.6*  HCT 34.1*  34.0*  MCV 98.3  PLT 200   Basic Metabolic Panel  Recent Labs  03/08/16 1905  NA 135  136  K 3.7  3.8  CL 99*  98*  CO2 23  GLUCOSE 241*  235*  BUN 71*  71*  CREATININE 2.65*  2.70*  CALCIUM 9.2   Liver Function Tests  Recent Labs  03/08/16 1905  AST 41  ALT 38  ALKPHOS 63  BILITOT 0.8  PROT 6.2*  ALBUMIN 2.8*   No results for input(s): LIPASE, AMYLASE in the  last 72 hours. Cardiac Enzymes  Recent Labs  03/08/16 1905  CKTOTAL 47  CKMB 3.8  TROPONINI 0.29*   BNP Invalid input(s): POCBNP D-Dimer No results for input(s): DDIMER in the last 72 hours. Hemoglobin A1C  Recent Labs  03/08/16 1905  HGBA1C 7.9*   Fasting Lipid Panel  Recent Labs  03/08/16 1905  CHOL 168  HDL 49  LDLCALC 90  TRIG 146  CHOLHDL 3.4   Thyroid Function Tests No results for input(s): TSH, T4TOTAL, T3FREE, THYROIDAB in the last 72 hours.  Invalid input(s): FREET3  TELE  Sinus rhythm at rate of 50s  Radiology/Studies  Ct Head Wo Contrast  02/14/2016  CLINICAL DATA:  Altered mental status EXAM: CT HEAD WITHOUT CONTRAST TECHNIQUE: Contiguous axial images were obtained from the base of the skull through the vertex without intravenous contrast. COMPARISON:  None. FINDINGS: No skull fracture is noted. Paranasal sinuses and mastoid air cells are unremarkable. No intracranial hemorrhage, mass effect or midline shift. No acute cortical infarction. No mass lesion is noted on this unenhanced scan. Mild  cerebral atrophy. Mild periventricular and patchy subcortical white matter decreased attenuation probable due to chronic small vessel ischemic changes. Mild atherosclerotic calcifications of carotid siphon. No intra or extra-axial fluid collection. IMPRESSION: No acute intracranial abnormality. Mild cerebral atrophy. Periventricular and patchy subcortical white matter decreased attenuation probable due to chronic small vessel ischemic changes. No definite acute cortical infarction. Electronically Signed   By: Natasha Mead M.D.   On: 02/14/2016 13:23   Portable Chest 1 View  02/13/2016  CLINICAL DATA:  Atrial fibrillation with rapid ventricular response EXAM: PORTABLE CHEST 1 VIEW COMPARISON:  Portable exam 2032 hours without priors for comparison. FINDINGS: Borderline enlargement of cardiac silhouette. Probable mitral annular calcification. Atherosclerotic calcification  aorta. Pulmonary vascularity normal. Bronchitic changes without pulmonary infiltrate, pleural effusion or pneumothorax. Bones unremarkable. IMPRESSION: Borderline enlargement of cardiac silhouette. Bronchitic changes without infiltrate. Electronically Signed   By: Ulyses Southward M.D.   On: 02/13/2016 20:46    ASSESSMENT AND PLAN  1. STEMI - Emergent cath showed  critical left main, significant proximal LAD and ostial RCA disease with severe mitral regurgitation, moderately to severely reduced LV systolic function and at least moderate mitral stenosis. Not a candidate for surgery or PCI. Continue medical therapy. Overall poor prognosis. Dr. Kirke Corin has discussed possible hospice care and husband at bedside agrees. Will request consult.   Has been seen by Hospice. There is a bed at [facility] [place].   Will make arrangements to transport her there.   2. Persistent atrial fibrillation - Maintaining sinus rhythm. Continue amiodarone and Eliquis.   3. Acute on chronic renal failure - No labs were done today. Poor candidate for dialysis.  4. Hypotension  BP is low but stable Continue comfort care  She is critically ill. Her prognosis is poor and her life expectancy is less than 6 months     Kristeen Miss, MD  03/10/2016 12:04 PM    [facility] [address] [place],  [address] [place], [place]  [address] Pager [phone] Phone: [phone]; Fax: [phone]

## 2016-03-10 NOTE — Consult Note (Signed)
   [facility]   03/10/2016  Lorenso QuarryMolly H Padmore 08-05-1933 [id number]  Patient screened for potential [facility] Care Management services. Patient is eligible for [facility] with [employer] ACO Registry. Electronic medical record reveals patient[facility].   [facility] services not appropriate at this time for community care management she will have full care management under Hospice care. For questions please contact:   Charlesetta ShanksVictoria Alven Alverio, RN BSN CCM [facility] Liaison  [phone] business mobile phone Toll free office [phone]

## 2016-03-10 NOTE — Progress Notes (Signed)
   03/10/16 1000  Clinical Encounter Type  Visited With Patient and family together  Visit Type Spiritual support  Referral From Chaplain  Consult/Referral To Hospice  Spiritual Encounters  Spiritual Needs Prayer;Emotional  Stress Factors  Patient Stress Factors Health changes  Family Stress Factors Health changes;Major life changes  Chaplain consult made, patient health declining, son and daughter at bedside, provided emotional support to patient, family and staff, as well as ministry of presence, therapeutic listening and prayer. [facility] be [facility].

## 2016-03-10 NOTE — Discharge Summary (Signed)
Discharge Summary    Patient ID: Ann [name],  MRN: [id number], DOB/AGE: 1933/05/03 82 y.o.  Admit date: 03/08/2016 Discharge date: 03/10/2016  Primary Care Provider: Marlow Baars Primary Cardiologist: Dr. Delano Metz  Discharge Diagnoses    Active Problems:   ST elevation myocardial infarction involving left main coronary artery (HCC)   ST elevation (STEMI) myocardial infarction involving left main coronary artery [facility])   Dyspnea   Palliative care encounter   End of life care   Allergies Allergies  Allergen Reactions  . Penicillins     Has patient had a PCN reaction causing immediate rash, facial/tongue/throat swelling, SOB or lightheadedness with hypotension: No Has patient had a PCN reaction causing severe rash involving mucus membranes or skin necrosis: No Has patient had a PCN reaction that required hospitalization No Has patient had a PCN reaction occurring within the last 10 years: Yes If all of the above answers are "NO", then may proceed with Cephalosporin use.     Diagnostic Studies/Procedures   Left Heart Cath and Coronary Angiography 03/08/16     There is moderate to severe left ventricular systolic dysfunction.  Ost RCA lesion, 90% stenosed.  Prox RCA lesion, 30% stenosed.  Ost LM to LM lesion, 99% stenosed.  Ost LAD to Prox LAD lesion, 95% stenosed.  Ost 1st Diag to 1st Diag lesion, 70% stenosed.  1. Critical left main stenosis with severe disease affecting the proximal LAD and ostial right coronary artery. The coronary arteries are heavily calcified. 2. Moderately to severely reduced LV systolic function with an ejection fraction of 30-35% with severe anterior and apical hypokinesis. 3. Severe mitral regurgitation. 4. At least moderate aortic stenosis with a peak to peak gradient of 22 mmHg.   Recommendations: The patient has critical coronary artery disease that is not approachable percutaneously. I discussed the case with Dr. Donata Clay .  After extensive review of the patient's medical history, reviewing the angiogram and findings and extensive discussion with the patient and the family, it was felt that the patient is not a candidate for CABG and valve surgery. There are no good options for PCI. Recommend medical therapy but overall prognosis is poor. Hospice care is appropriate.   _____________   History of Present Illness   Ann [name] is a 82 year old female with a past medical history of persistent atrial fibrillation, hyperlipidemia, hypertension, hypothyroidism and diabetes mellitus. She was hospitalized in May of this year at [facility] at which time she was treated for Afib with RVR, requiring DCCV x2. She required Bipap and IV diuresis for acute pulmonary edema. She was converted to NSR that hospitalization with IV Amiodarone.   She presented to [facility] on 03/08/16 from SNF with fatigue and chest pain. Pain was chest pressure and heaviness that radiated to her left arm. When EMS arrived to her SNF she had ST elevation in V1,V2, and aVR. She was sent for emergent cath.   Hospital Course  Left heart cath showed severe coronary artery disease. Her left main is 99% stenosed, proximal LAD 95% stenosed, RCA 90% stenosed. There were no lesions amendable to PCI.   Her case was discussed with CV surgery. It was felt that the patient would no have a realistic chance of surviving surgery given her co morbidities. Plan was to pursue medical management of her severe coronary artery disease and offer Hospice care.   Palliative care has seen the patient and noted the patient felt very peaceful about the decision  to pursue symptom management and end of life care.   [facility] [address]. She was seen today by Dr. Elease HashimotoNahser. Medications will be provided by the Hospice agency.  _____________  Discharge Vitals Blood pressure 82/43, pulse 51, temperature 97 F (36.1 C), temperature source Oral, resp. rate 20,  SpO2 90 %.  There were no vitals filed for this visit.  Labs & Radiologic Studies     CBC  Recent Labs  03/08/16 1905  WBC 8.3  NEUTROABS 6.6  HGB 10.9*  11.6*  HCT 34.1*  34.0*  MCV 98.3  PLT 200   Basic Metabolic Panel  Recent Labs  03/08/16 1905  NA 135  136  K 3.7  3.8  CL 99*  98*  CO2 23  GLUCOSE 241*  235*  BUN 71*  71*  CREATININE 2.65*  2.70*  CALCIUM 9.2   Liver Function Tests  Recent Labs  03/08/16 1905  AST 41  ALT 38  ALKPHOS 63  BILITOT 0.8  PROT 6.2*  ALBUMIN 2.8*   Cardiac Enzymes  Recent Labs  03/08/16 1905  CKTOTAL 47  CKMB 3.8  TROPONINI 0.29*   Hemoglobin A1C  Recent Labs  03/08/16 1905  HGBA1C 7.9*   Fasting Lipid Panel  Recent Labs  03/08/16 1905  CHOL 168  HDL 49  LDLCALC 90  TRIG 146  CHOLHDL 3.4    Ct Head Wo Contrast  02/14/2016  CLINICAL DATA:  Altered mental status EXAM: CT HEAD WITHOUT CONTRAST TECHNIQUE: Contiguous axial images were obtained from the base of the skull through the vertex without intravenous contrast. COMPARISON:  None. FINDINGS: No skull fracture is noted. Paranasal sinuses and mastoid air cells are unremarkable. No intracranial hemorrhage, mass effect or midline shift. No acute cortical infarction. No mass lesion is noted on this unenhanced scan. Mild cerebral atrophy. Mild periventricular and patchy subcortical white matter decreased attenuation probable due to chronic small vessel ischemic changes. Mild atherosclerotic calcifications of carotid siphon. No intra or extra-axial fluid collection. IMPRESSION: No acute intracranial abnormality. Mild cerebral atrophy. Periventricular and patchy subcortical white matter decreased attenuation probable due to chronic small vessel ischemic changes. No definite acute cortical infarction. Electronically Signed   By: Natasha MeadLiviu  Pop M.D.   On: 02/14/2016 13:23   Portable Chest 1 View  02/13/2016  CLINICAL DATA:  Atrial fibrillation with rapid  ventricular response EXAM: PORTABLE CHEST 1 VIEW COMPARISON:  Portable exam 2032 hours without priors for comparison. FINDINGS: Borderline enlargement of cardiac silhouette. Probable mitral annular calcification. Atherosclerotic calcification aorta. Pulmonary vascularity normal. Bronchitic changes without pulmonary infiltrate, pleural effusion or pneumothorax. Bones unremarkable. IMPRESSION: Borderline enlargement of cardiac silhouette. Bronchitic changes without infiltrate. Electronically Signed   By: Ulyses SouthwardMark  Boles M.D.   On: 02/13/2016 20:46    Disposition   Pt is being discharged home today in good condition.  Follow-up Plans & Appointments    Discharge Instructions    Diet - low sodium heart healthy    Complete by:  As directed      Increase activity slowly    Complete by:  As directed            Discharge Medications   Current Discharge Medication List    STOP taking these medications     albuterol (PROVENTIL HFA;VENTOLIN HFA) 108 (90 BASE) MCG/ACT inhaler      amiodarone (PACERONE) 200 MG tablet      apixaban (ELIQUIS) 2.5 MG TABS tablet  aspirin EC 81 MG tablet      atorvastatin (LIPITOR) 40 MG tablet      bisacodyl (DULCOLAX) 5 MG EC tablet      calcium carbonate (TUMS - DOSED IN MG ELEMENTAL CALCIUM) 500 MG chewable tablet      calcium citrate-vitamin D (CITRACAL+D) 315-200 MG-UNIT tablet      docusate sodium (COLACE) 100 MG capsule      feeding supplement, GLUCERNA SHAKE, (GLUCERNA SHAKE) LIQD      fluticasone (FLONASE) 50 MCG/ACT nasal spray      furosemide (LASIX) 40 MG tablet      glucose blood test strip      Insulin Detemir (LEVEMIR FLEXPEN) 100 UNIT/ML Pen      insulin lispro (HUMALOG) 100 UNIT/ML injection      Insulin Pen Needle (PEN NEEDLES 31GX5/16") 31G X 8 MM MISC      levothyroxine (SYNTHROID, LEVOTHROID) 137 MCG tablet      metoprolol tartrate (LOPRESSOR) 25 MG tablet      Multiple Vitamin (MULTIVITAMIN) capsule      Polyethyl  Glycol-Propyl Glycol 0.4-0.3 % SOLN      polyethylene glycol (MIRALAX / GLYCOLAX) packet      Cholecalciferol (VITAMIN D3) 2000 units capsule             Outstanding Labs/Studies    Duration of Discharge Encounter   Greater than 30 minutes including physician time.  Signed, Little Ishikawa NP 03/10/2016, 1:00 PM   Attending Note:   The patient was seen and examined.  Agree with assessment and plan as noted above.  Changes made to the above note as needed.  Patient seen and independently examined with Suzzette Righter, NP.   We discussed all aspects of the encounter. I agree with the assessment and plan as stated above.  Pt has critical CAD but unfortunately is not a candidate for PCI or CABG. I've had extensive conversation with patient and family and they wish for her to be discharged to [facility] of [address]  Ive discussed with the hospice nurse    I have spent a total of 40 minutes with patient reviewing hospital  notes , telemetry, EKGs, labs and examining patient as well as establishing an assessment and plan that was discussed with the patient. > 50% of time was spent in direct patient care.    Vesta Mixer, Montez Hageman., MD, [facility] 2016/03/30, 4:44 PM [address],  [address] Office [phone] Pager [phone]

## 2016-03-10 NOTE — Progress Notes (Signed)
Pt d[facility] via [facility]; husband at bedside.  Ann SpragueYates, Ann [name]

## 2016-03-10 NOTE — Consult Note (Signed)
Consultation Note Date: 03/10/2016   Patient Name: Ann [name]  DOB: 19-Mar-1933  MRN: [id number]  Age / Sex: 82 y.o., female  PCP: Tacey Ruiz, MD Referring Physician: Wellington Hampshire, MD  Reason for Consultation: Non pain symptom management and Terminal Care  HPI/Patient Profile: 82 y.o. female  with past medical history of afib, type 1 dm, asthma, CKD, who admitted on 03/08/2016 with a myocardial infarction and possible cardiac arrest.  She was recently admitted to [facility] on 5/13 with a seizure like episode.  She underwent had some runs of vtach and VF and then underwent a code blue.  Two days prior to this admission she began having excessive fatigue and dyspnea.  She developed chest tightness. EKG showed 1 mm of ST elevation in V1 and V2 as well as aVR with global ST depression. Thus, the patient was transferred for emergent cardiac cath.  She was found to have extensive CAD with an EF of approximately 30-35% and severe valvular disease.  She was admitted by cardiology and evaluated by CT surgery.  She was not a good surgical candidate and there were no good options of endovascular intervention.  Hospice care was felt to be the best course of action.  The patient had a long career in "numbers" and operations management.  She worked for [employer] for a period of time when it was first being developed.  She has a husband (who also has poor cardiac health) and 4 children. She has a good understanding of what is wrong with her and what happens next.  She has requested comfort care and transfer to [facility] as soon as they have a bed available.   She tells me her husband and children understand her condition.  She seems very much at peace with her situation.    She denies pain.  Her only complaint at this point is some difficulty breathing.  I met with her son at bedside and explained  the results of the heart cath, and that the next steps now are to make her comfortable.  He requested an update on her hospice bed status.    Primary decision maker:  The patient herself.    SUMMARY OF RECOMMENDATIONS    I reduced medications to a minimum - comfort measures only.  I have kept SSI as the patient is a type 1 DM and I don't want her to become uncomfortable with hypo or hyperglycemia.  Low dose Oxy IR scheduled for dyspnea or difficulty breathing.  IV Morphine 1-4 mg is ordered for pain.  Code Status/Advance Care Planning:  DNR   Symptom Management:   As above.  Palliative Prophylaxis:   Frequent Pain Assessment  Additional Recommendations (Limitations, Scope, Preferences):  Full Comfort Care and Minimize Medications  Psycho-social/Spiritual:   Desire for further Chaplaincy support:yes  Additional Recommendations: Caregiving  Support/Resources  Prognosis:   Hours - Days.  End stage heart disease.  Patient's systolic blood pressure is in the 60s, pulse rate in the 40s.  I anticipate she will not be with [place] long.  Discharge Planning: Hospice facility      Primary Diagnoses: Present on Admission:  . ST elevation (STEMI) myocardial infarction involving left main coronary artery ([place])  I have reviewed the medical record, interviewed the patient and family, and examined the patient. The following aspects are pertinent.  Past Medical History  Diagnosis Date  . Dyslipidemia   . Osteoporosis, unspecified   . Unspecified hypothyroidism   . Unspecified essential hypertension   . Type I (juvenile type) diabetes mellitus without mention of complication, not stated as uncontrolled   . Atrial fibrillation ([place])   . Allergic rhinitis, cause unspecified    Social History   Social History  . Marital Status: Married    Spouse Name: N/A  . Number of Children: N/A  . Years of Education: N/A   Social History Main Topics  . Smoking status: Never Smoker     . Smokeless tobacco: None  . Alcohol Use: No  . Drug Use: No  . Sexual Activity: Not Asked   Other Topics Concern  . None   Social History Narrative   Family History  Problem Relation Age of Onset  . Asthma     Scheduled Meds: . amiodarone  200 mg Oral Daily  . aspirin  81 mg Oral Daily  . docusate sodium  100 mg Oral BID  . feeding supplement (GLUCERNA SHAKE)  237 mL Oral BID BM  . fluticasone  2 spray Each Nare QHS  . furosemide  40 mg Oral Daily  . insulin aspart  0-20 Units Subcutaneous Q4H  . oxyCODONE  2.5 mg Oral Q8H  . sodium chloride flush  3 mL Intravenous Q12H  . sodium chloride flush  3 mL Intravenous Q12H   Continuous Infusions:  PRN Meds:.sodium chloride, sodium chloride, acetaminophen **OR** acetaminophen, albuterol, antiseptic oral rinse, bisacodyl, glycopyrrolate **OR** glycopyrrolate **OR** glycopyrrolate, haloperidol **OR** haloperidol **OR** haloperidol lactate, morphine injection, ondansetron (ZOFRAN) IV, polyvinyl alcohol, simethicone, sodium chloride flush, sodium chloride flush Medications Prior to Admission:  Prior to Admission medications   Medication Sig Start Date End Date Taking? Authorizing Provider  albuterol (PROVENTIL HFA;VENTOLIN HFA) 108 (90 BASE) MCG/ACT inhaler Inhale 2 puffs into the lungs every 6 (six) hours as needed for wheezing. 01/13/12 03/09/16 Yes Deneise Lever, MD  amiodarone (PACERONE) 200 MG tablet Take 220m twice daily for 1 week and then take 2063monce daily. Patient taking differently: Take 200 mg by mouth daily.  02/18/16  Yes GoBonnielee HaffMD  apixaban (ELIQUIS) 2.5 MG TABS tablet Take 1 tablet (2.5 mg total) by mouth 2 (two) times daily. 02/18/16  Yes GoBonnielee HaffMD  aspirin EC 81 MG tablet Take 324 mg by mouth once.   Yes Historical Provider, MD  atorvastatin (LIPITOR) 40 MG tablet Take 1 tablet (40 mg total) by mouth daily. 02/18/16  Yes GoBonnielee HaffMD  bisacodyl (DULCOLAX) 5 MG EC tablet Take 2 tablets (10 mg  total) by mouth daily as needed for moderate constipation. 02/18/16  Yes GoBonnielee HaffMD  calcium carbonate (TUMS - DOSED IN MG ELEMENTAL CALCIUM) 500 MG chewable tablet Chew 1 tablet by mouth 2 (two) times daily as needed for indigestion or heartburn.   Yes Historical Provider, MD  calcium citrate-vitamin D (CITRACAL+D) 315-200 MG-UNIT tablet Take 1 tablet by mouth daily.   Yes Historical Provider, MD  docusate sodium (COLACE) 100 MG capsule Take 1 capsule (100 mg total) by mouth 2 (two) times daily. Patient  taking differently: Take 100 mg by mouth 2 (two) times daily as needed.  02/18/16  Yes Bonnielee Haff, MD  feeding supplement, GLUCERNA SHAKE, (GLUCERNA SHAKE) LIQD Take 237 mLs by mouth 2 (two) times daily between meals. 02/18/16  Yes Bonnielee Haff, MD  fluticasone (FLONASE) 50 MCG/ACT nasal spray Place 2 sprays into both nostrils at bedtime.   Yes Historical Provider, MD  furosemide (LASIX) 40 MG tablet Take 1 tablet (40 mg total) by mouth daily. Patient taking differently: Take 40-80 mg by mouth daily. 76m daily, can take 846mas needed 02/18/16  Yes GoBonnielee HaffMD  glucose blood test strip 1 each by Other route as needed. Use as instructed    Yes Historical Provider, MD  Insulin Detemir (LEVEMIR FLEXPEN) 100 UNIT/ML Pen Inject 18 Units into the skin daily at 10 pm. 02/18/16  Yes GoBonnielee HaffMD  insulin lispro (HUMALOG) 100 UNIT/ML injection Inject 2-6 Units into the skin 3 (three) times daily with meals. Sliding scale   Yes Historical Provider, MD  Insulin Pen Needle (PEN NEEDLES 31GX5/16") 31G X 8 MM MISC Use as directed 10/23/13  Yes Historical Provider, MD  levothyroxine (SYNTHROID, LEVOTHROID) 137 MCG tablet Take 137 mcg by mouth daily before breakfast.   Yes Historical Provider, MD  metoprolol tartrate (LOPRESSOR) 25 MG tablet Take 1 tablet (25 mg total) by mouth 2 (two) times daily. 02/18/16  Yes GoBonnielee HaffMD  Multiple Vitamin (MULTIVITAMIN) capsule Take 1 capsule by mouth  daily.     Yes Historical Provider, MD  Polyethyl Glycol-Propyl Glycol 0.4-0.3 % SOLN Place 1-2 drops into both eyes daily as needed.    Yes Historical Provider, MD  polyethylene glycol (MIRALAX / GLYCOLAX) packet Take 17 g by mouth daily. Patient taking differently: Take 17 g by mouth daily as needed for moderate constipation.  02/18/16  Yes GoBonnielee HaffMD  Cholecalciferol (VITAMIN D3) 2000 units capsule Take 1 capsule by mouth daily.    Historical Provider, MD   Allergies  Allergen Reactions  . Penicillins     Has patient had a PCN reaction causing immediate rash, facial/tongue/throat swelling, SOB or lightheadedness with hypotension: No Has patient had a PCN reaction causing severe rash involving mucus membranes or skin necrosis: No Has patient had a PCN reaction that required hospitalization No Has patient had a PCN reaction occurring within the last 10 years: Yes If all of the above answers are "NO", then may proceed with Cephalosporin use.    Review of Systems:  Complains only of some difficulty breathing and flatulence.  Physical Exam  Extremely pleasant, calm appearing elderly female, Wn, Wd, HOH CV brady Resp on N/C, NAD Extremities:  Cold, able to move all 4.  Vital Signs: BP 64/40 mmHg  Pulse 42  Temp(Src) 97.2 F (36.2 C) (Oral)  Resp 24  SpO2 100% Pain Assessment: No/denies pain   Pain Score: 0-No pain   SpO2: SpO2: 100 % O2 Device:SpO2: 100 % O2 Flow Rate: .O2 Flow Rate (L/min): 4 L/min  IO: Intake/output summary:   Intake/Output Summary (Last 24 hours) at 03/10/16 0932 Last data filed at 03/10/16 0800  Gross per 24 hour  Intake    770 ml  Output    550 ml  Net    220 ml    LBM:   Baseline Weight:   Most recent weight:       Palliative Assessment/Data:   Flowsheet Rows        Most Recent Value   Intake Tab  Referral Department  Cardiology   Unit at Time of Referral  Intermediate Care Unit   Palliative Care Primary Diagnosis  Cardiac    Date Notified  03/09/16   Palliative Care Type  New Palliative care   Reason for referral  End of Life Care Assistance   Date of Admission  03/08/16   Date first seen by Palliative Care  03/10/16   # of days Palliative referral response time  1 Day(s)   # of days IP prior to Palliative referral  1   Clinical Assessment    Palliative Performance Scale Score  20%   Pain Max last 24 hours  5   Pain Min Last 24 hours  1   Dyspnea Max Last 24 Hours  5   Dyspnea Min Last 24 hours  1   Psychosocial & Spiritual Assessment    Palliative Care Outcomes    Patient/Family meeting held?  Yes   Who was at the meeting?  patient and son   Palliative Care Outcomes  Improved non-pain symptom therapy, Provided end of life care assistance, Transitioned to hospice      Time In: 8:30 Time Out: 9:30 Time Total: 60 min. Greater than 50%  of this time was spent counseling and coordinating care related to the above assessment and plan.  Signed by: Imogene Burn, PA-C Palliative Medicine Pager: [phone]   Please contact Palliative Medicine Team phone at [phone] for questions and concerns.  For individual provider: See Shea Evans

## 2016-03-10 NOTE — Clinical Social Work Note (Addendum)
Clinical Social Work Assessment  Patient Details  Name: Ann [name] MRN: [id number] Date of Birth: 1933-05-26  Date of referral:  03/10/16               Reason for consult:  Discharge Planning                Permission sought to share information with:  Family Supports, Customer service manager Permission granted to share information::  Yes, Verbal Permission Granted  Name::     Residential Hospice Facilities  Agency::  [facility]  Relationship::     Contact Information:  Son  Housing/Transportation Living arrangements for the past 2 months:  Single Family Home Source of Information:  Adult Children Patient Interpreter Needed:  None Criminal Activity/Legal Involvement Pertinent to Current Situation/Hospitalization:  No - Comment as needed Significant Relationships:  None Lives with:  Spouse Do you feel safe going back to the place where you live?  Yes Need for family participation in patient care:  Yes (Comment)  Care giving concerns:  Patient and patient family met with Palliative care and would like residential hospice referral.    Social Worker assessment / plan:  CSW spoke with Palliative care and patient's son. CSW explained residential hospice referral process and answered his questions. CSW faxed referral to [facility]. CSW continuing  to follow for discharge needs.   Employment status:  Retired Forensic scientist:  [employer] PT Recommendations:  Not assessed at this time Information / Referral to community resources:  Other (Comment Required) (Residential Hospice)  Patient/Family's Response to care:  The patient's son is happy with the care the patient has received.   Patient/Family's Understanding of and Emotional Response to Diagnosis, Current Treatment, and Prognosis:  The patient's son has a good understanding of why the patient was admitted. He understands the care plan and what the patient  will need post discharge.  Emotional  Assessment Appearance:  Other (Comment Required (unable to assess) Attitude/Demeanor/Rapport:  Unable to Assess Affect (typically observed):  Unable to Assess Orientation:  Oriented to Self, Oriented to Place, Oriented to  Time, Oriented to Situation Alcohol / Substance use:  Not Applicable Psych involvement (Current and /or in the community):  No (Comment)  Discharge Needs  Concerns to be addressed:  Discharge Planning Concerns Readmission within the last 30 days:  Yes Current discharge risk:  None Barriers to Discharge:  Continued Medical Work up   [employer], LCSW [phone]  03/10/2016, 10:07 AM

## 2016-03-10 NOTE — Progress Notes (Signed)
Report called to Victorino DikeJennifer, RN at [facility]; pt to [facility] this afternoon; husband at bedside; pt alert and oriented and aware of situation and transfer to Hospice; no complaints of pain at this time; pt reports nausea has resolved "slightly" after IV Zofran given earlier; will cont. To monitor.  Benjamine SpragueYates, My Rinke A

## 2016-03-24 DIAGNOSIS — E11319 Type 2 diabetes mellitus with unspecified diabetic retinopathy without macular edema: Secondary | ICD-10-CM | POA: Diagnosis not present

## 2016-03-24 DIAGNOSIS — I482 Chronic atrial fibrillation: Secondary | ICD-10-CM | POA: Diagnosis not present

## 2016-03-28 DEATH — deceased

## 2017-10-02 IMAGING — CT CT HEAD W/O CM
3 of 4 series · 17 of 47 positions shown, 20 images · non-contrast
Comparison: None.

CLINICAL DATA: Altered mental status

EXAM:
CT HEAD WITHOUT CONTRAST
TECHNIQUE: Contiguous axial images were obtained from the base of the skull
through the vertex without intravenous contrast.

[Series 201: head w/o, idose (1) · axial · non-contrast · 0.45mm/px · z∈[+47,+167]mm · 11 of 30 slices shown, 14 images]
[im 3/30  brain]
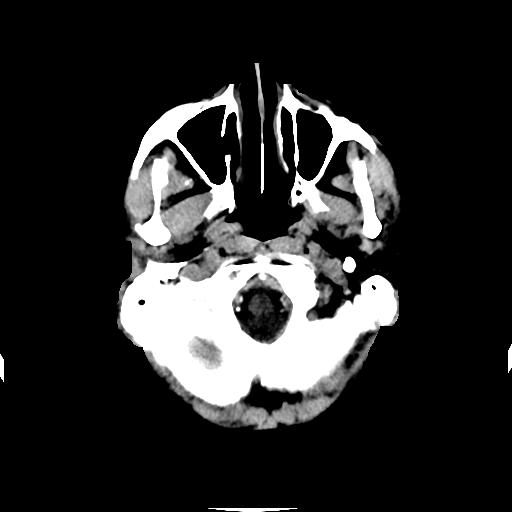
[im 3/30  bone]
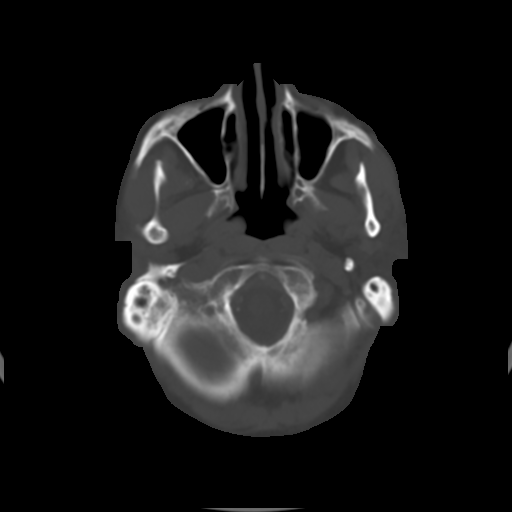
[im 5/30  brain]
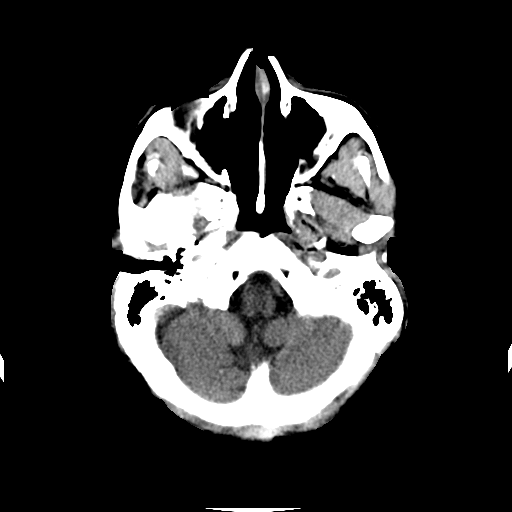
[im 7/30  brain]
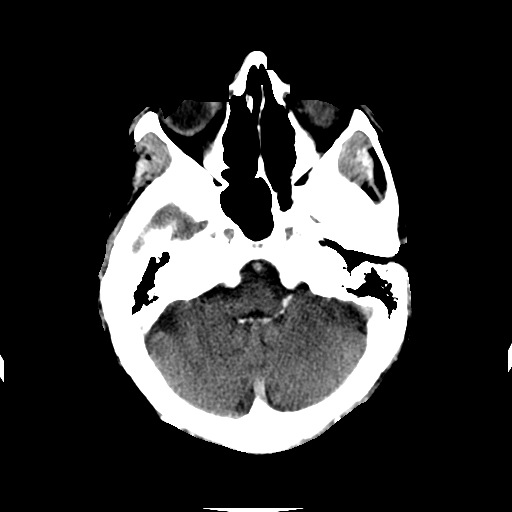
[im 11/30  brain]
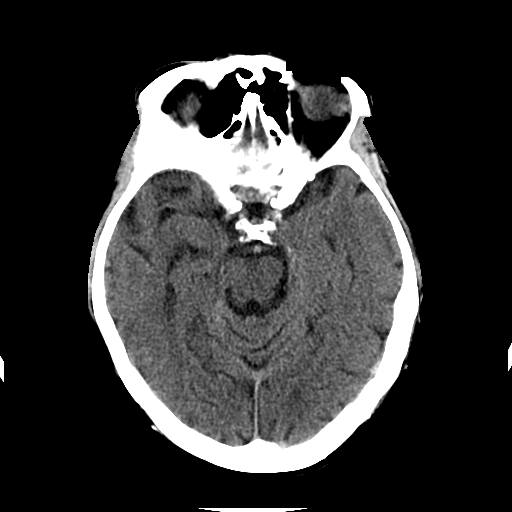
[im 13/30  brain]
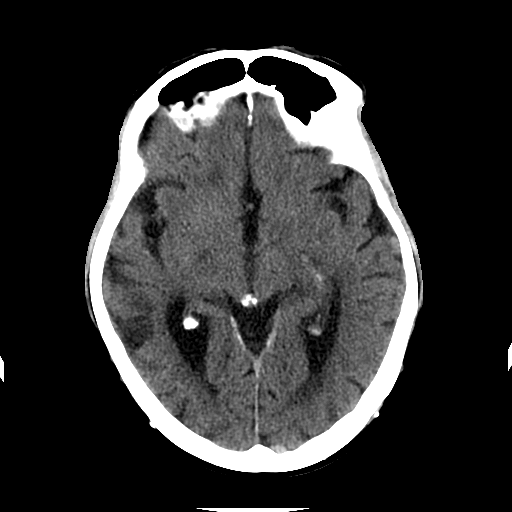
[im 13/30  bone]
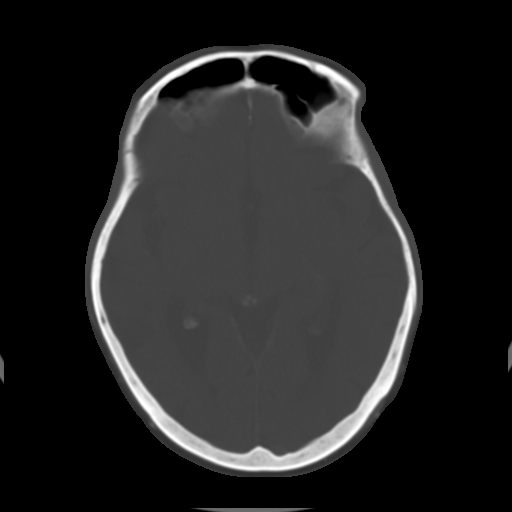
[im 15/30  brain]
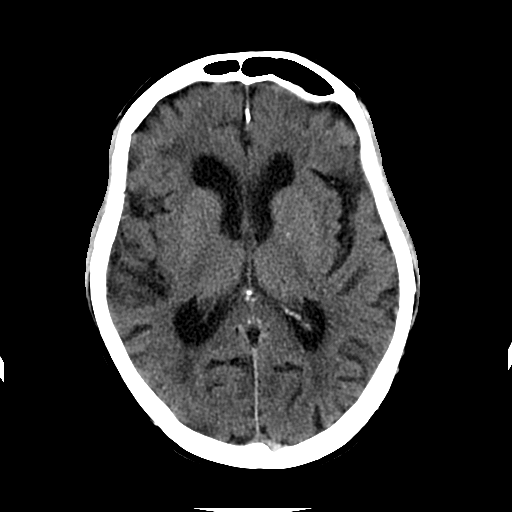
[im 17/30  brain]
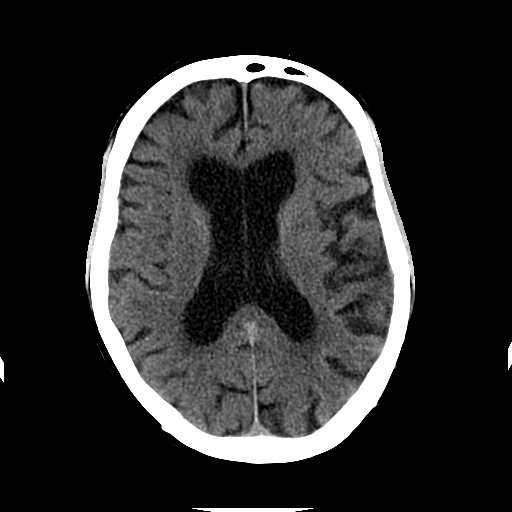
[im 19/30  brain]
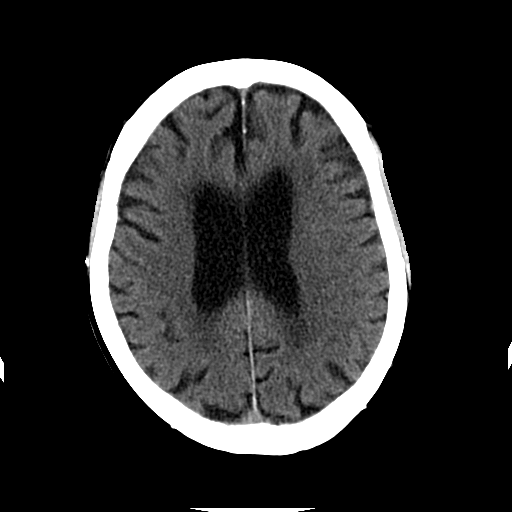
[im 23/30  brain]
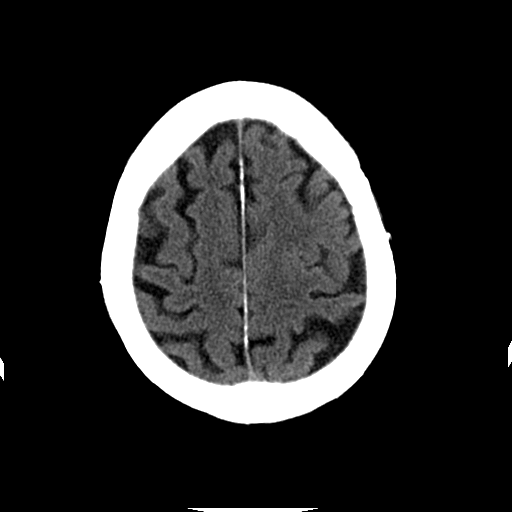
[im 23/30  bone]
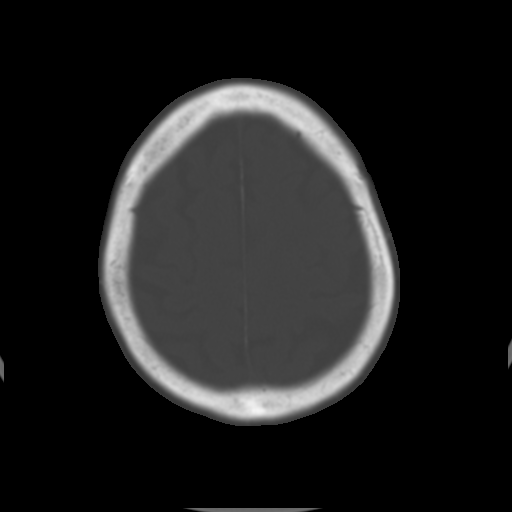
[im 25/30  brain]
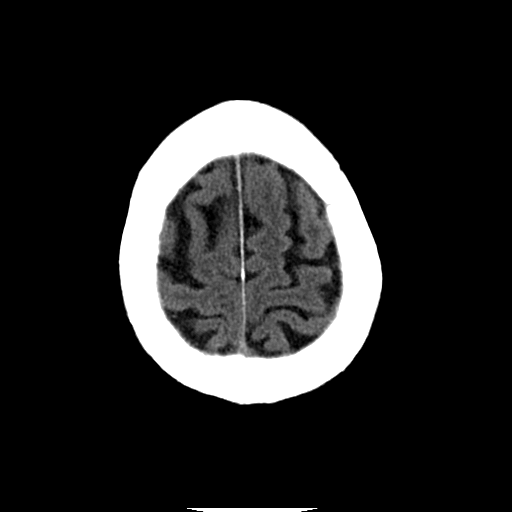
[im 27/30  brain]
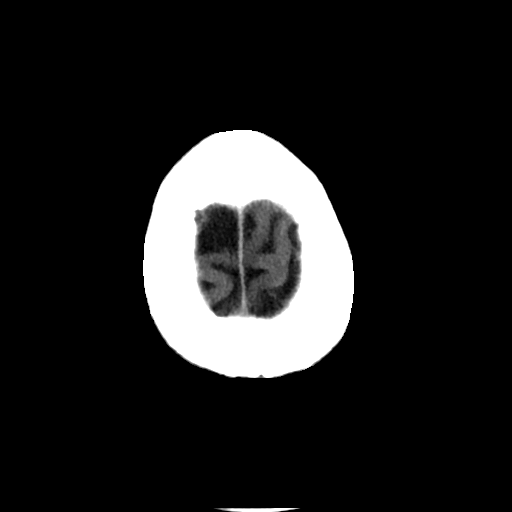

[Series 203: coronal st, idose (1) · coronal · 0.40mm/px · 3 of 73 slices shown]
[im 25/73  brain]
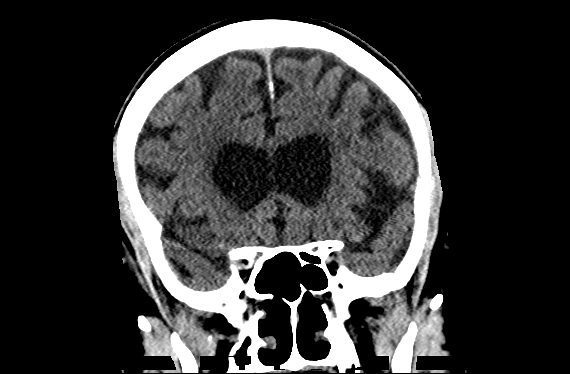
[im 33/73  brain]
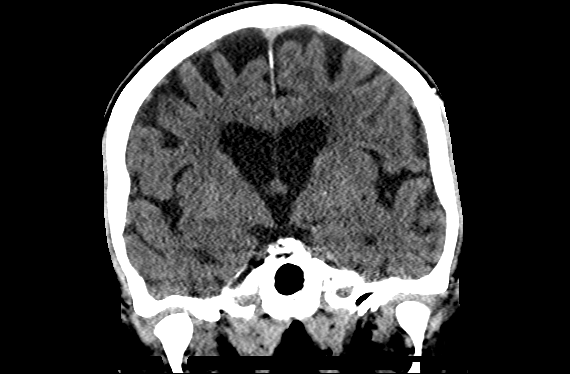
[im 41/73  brain]
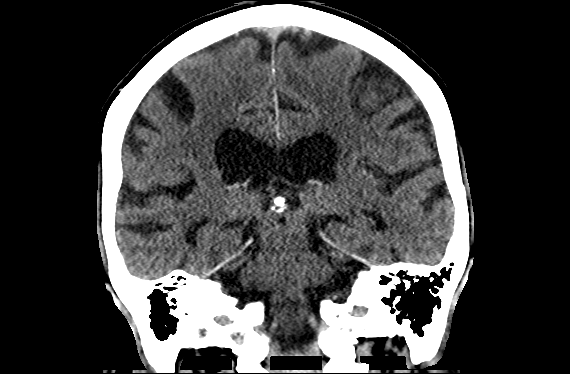

[Series 204: sagittal st, idose (1) · sagittal · 0.40mm/px · 3 of 76 slices shown]
[im 26/76  brain]
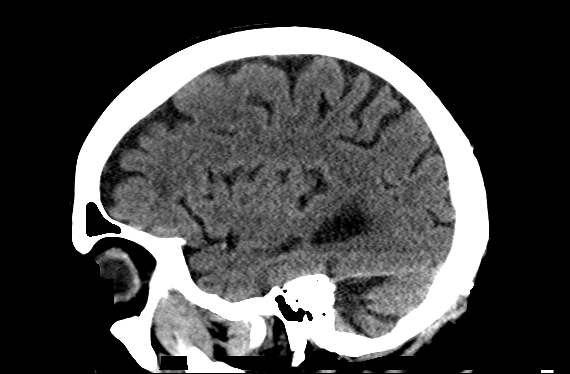
[im 38/76  brain]
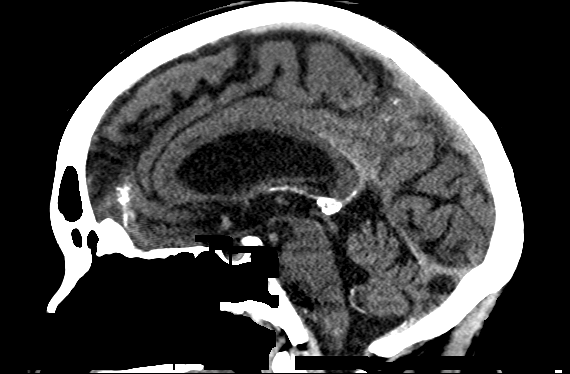
[im 51/76  brain]
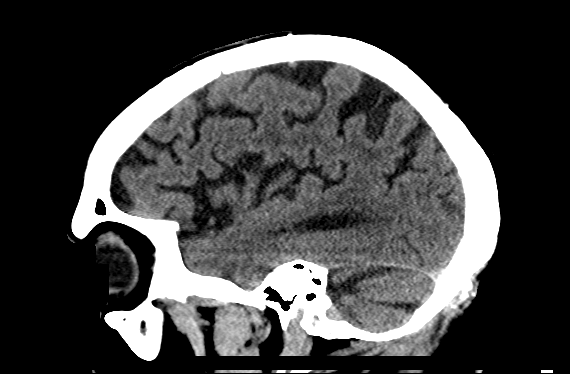

[17 of 47 positions shown; findings below may reference images not displayed]

FINDINGS: No skull fracture is noted. Paranasal sinuses and mastoid air cells
are unremarkable.

No intracranial hemorrhage, mass effect or midline shift. No acute
cortical infarction. No mass lesion is noted on this unenhanced
scan. Mild cerebral atrophy. Mild periventricular and patchy
subcortical white matter decreased attenuation probable due to
chronic small vessel ischemic changes.

Mild atherosclerotic calcifications of carotid siphon.

No intra or extra-axial fluid collection.
IMPRESSION: No acute intracranial abnormality. Mild cerebral atrophy.
Periventricular and patchy subcortical white matter decreased
attenuation probable due to chronic small vessel ischemic changes.
No definite acute cortical infarction.
